# Patient Record
Sex: Male | Born: 1970 | Race: White | Hispanic: No | Smoking: Never smoker
Health system: Southern US, Community
[De-identification: ages and names within clinical notes are randomized; demographics above are authoritative.]

## PROBLEM LIST (undated history)

## (undated) DIAGNOSIS — M6289 Other specified disorders of muscle: Secondary | ICD-10-CM

## (undated) DIAGNOSIS — N319 Neuromuscular dysfunction of bladder, unspecified: Secondary | ICD-10-CM

## (undated) DIAGNOSIS — M8580 Other specified disorders of bone density and structure, unspecified site: Secondary | ICD-10-CM

## (undated) DIAGNOSIS — N4830 Priapism, unspecified: Secondary | ICD-10-CM

## (undated) DIAGNOSIS — K900 Celiac disease: Secondary | ICD-10-CM

## (undated) DIAGNOSIS — N419 Inflammatory disease of prostate, unspecified: Secondary | ICD-10-CM

## (undated) HISTORY — DX: Other specified disorders of muscle: M62.89

## (undated) HISTORY — PX: LUMBAR FUSION: SHX111

## (undated) HISTORY — DX: Inflammatory disease of prostate, unspecified: N41.9

## (undated) HISTORY — PX: ORTHOPEDIC SURGERY: SHX850

## (undated) HISTORY — DX: Celiac disease: K90.0

## (undated) HISTORY — DX: Priapism, unspecified: N48.30

## (undated) HISTORY — DX: Other specified disorders of bone density and structure, unspecified site: M85.80

## (undated) HISTORY — PX: NASAL SINUS SURGERY: SHX719

## (undated) HISTORY — DX: Neuromuscular dysfunction of bladder, unspecified: N31.9

---

## 2004-07-21 ENCOUNTER — Emergency Department (HOSPITAL_COMMUNITY): Admission: EM | Admit: 2004-07-21 | Discharge: 2004-07-21 | Payer: Self-pay | Admitting: Emergency Medicine

## 2005-12-19 ENCOUNTER — Ambulatory Visit: Payer: Self-pay | Admitting: Family Medicine

## 2006-01-03 ENCOUNTER — Ambulatory Visit: Payer: Self-pay | Admitting: Family Medicine

## 2006-01-29 ENCOUNTER — Ambulatory Visit: Payer: Self-pay | Admitting: Family Medicine

## 2006-02-11 ENCOUNTER — Ambulatory Visit: Payer: Self-pay | Admitting: Internal Medicine

## 2006-02-12 ENCOUNTER — Ambulatory Visit: Payer: Self-pay | Admitting: Internal Medicine

## 2006-02-12 ENCOUNTER — Encounter (INDEPENDENT_AMBULATORY_CARE_PROVIDER_SITE_OTHER): Payer: Self-pay | Admitting: *Deleted

## 2006-02-12 ENCOUNTER — Encounter (INDEPENDENT_AMBULATORY_CARE_PROVIDER_SITE_OTHER): Payer: Self-pay | Admitting: Specialist

## 2006-02-12 HISTORY — PX: COLONOSCOPY W/ BIOPSIES: SHX1374

## 2006-02-22 ENCOUNTER — Ambulatory Visit: Payer: Self-pay | Admitting: Cardiology

## 2006-03-04 ENCOUNTER — Ambulatory Visit (HOSPITAL_COMMUNITY): Admission: RE | Admit: 2006-03-04 | Discharge: 2006-03-04 | Payer: Self-pay | Admitting: Internal Medicine

## 2006-03-04 ENCOUNTER — Encounter: Payer: Self-pay | Admitting: Internal Medicine

## 2006-03-04 ENCOUNTER — Encounter (INDEPENDENT_AMBULATORY_CARE_PROVIDER_SITE_OTHER): Payer: Self-pay | Admitting: *Deleted

## 2006-03-08 ENCOUNTER — Ambulatory Visit: Payer: Self-pay | Admitting: Internal Medicine

## 2006-03-08 LAB — CONVERTED CEMR LAB
Alkaline Phosphatase: 90 units/L (ref 39–117)
Basophils Absolute: 0 10*3/uL (ref 0.0–0.1)
Basophils Relative: 0.2 % (ref 0.0–1.0)
CO2: 27 meq/L (ref 19–32)
Calcium: 7.9 mg/dL — ABNORMAL LOW (ref 8.4–10.5)
Chloride: 108 meq/L (ref 96–112)
Eosinophil percent: 2 % (ref 0.0–5.0)
Glucose, Bld: 76 mg/dL (ref 70–99)
Hemoglobin: 14.9 g/dL (ref 13.0–17.0)
Lymphocytes Relative: 18.9 % (ref 12.0–46.0)
MCV: 89 fL (ref 78.0–100.0)
Monocytes Absolute: 0.5 10*3/uL (ref 0.2–0.7)
Monocytes Relative: 7.9 % (ref 3.0–11.0)
Neutro Abs: 4.3 10*3/uL (ref 1.4–7.7)
Sed Rate: 5 mm/hr (ref 0–20)
Sodium: 141 meq/L (ref 135–145)

## 2006-03-13 ENCOUNTER — Ambulatory Visit: Payer: Self-pay | Admitting: Internal Medicine

## 2006-03-13 LAB — CONVERTED CEMR LAB
Bacteria, U Microscopic: NEGATIVE /hpf
Epithelial cells, urine: NEGATIVE /lpf
Specific Gravity, Urine: 1.02 (ref 1.000–1.03)
WBC number, urine, microscopy: NONE SEEN /hpf
pH: 6 (ref 5.0–8.0)

## 2006-03-14 ENCOUNTER — Encounter (INDEPENDENT_AMBULATORY_CARE_PROVIDER_SITE_OTHER): Payer: Self-pay | Admitting: *Deleted

## 2006-03-14 ENCOUNTER — Ambulatory Visit (HOSPITAL_COMMUNITY): Admission: RE | Admit: 2006-03-14 | Discharge: 2006-03-14 | Payer: Self-pay | Admitting: Internal Medicine

## 2006-03-14 ENCOUNTER — Ambulatory Visit: Payer: Self-pay | Admitting: Internal Medicine

## 2006-03-14 HISTORY — PX: UPPER GASTROINTESTINAL ENDOSCOPY: SHX188

## 2006-04-09 ENCOUNTER — Encounter: Admission: RE | Admit: 2006-04-09 | Discharge: 2006-07-08 | Payer: Self-pay | Admitting: Internal Medicine

## 2006-04-10 ENCOUNTER — Ambulatory Visit: Payer: Self-pay | Admitting: Family Medicine

## 2006-04-30 ENCOUNTER — Ambulatory Visit: Payer: Self-pay | Admitting: Internal Medicine

## 2006-04-30 LAB — CONVERTED CEMR LAB
RBC Folate: 266 ng/mL (ref 180–600)
Vit D, 1,25-Dihydroxy: 12 — ABNORMAL LOW (ref 20–57)

## 2006-05-03 ENCOUNTER — Ambulatory Visit: Payer: Self-pay | Admitting: Family Medicine

## 2006-05-08 ENCOUNTER — Ambulatory Visit: Payer: Self-pay | Admitting: Family Medicine

## 2006-11-01 ENCOUNTER — Ambulatory Visit: Payer: Self-pay | Admitting: Family Medicine

## 2006-11-01 DIAGNOSIS — J019 Acute sinusitis, unspecified: Secondary | ICD-10-CM | POA: Insufficient documentation

## 2007-01-31 ENCOUNTER — Ambulatory Visit: Payer: Self-pay | Admitting: Internal Medicine

## 2007-01-31 LAB — CONVERTED CEMR LAB: Vit D, 1,25-Dihydroxy: 31 (ref 30–89)

## 2007-02-13 ENCOUNTER — Ambulatory Visit: Payer: Self-pay | Admitting: Family Medicine

## 2007-02-13 DIAGNOSIS — E559 Vitamin D deficiency, unspecified: Secondary | ICD-10-CM | POA: Insufficient documentation

## 2007-02-13 DIAGNOSIS — K900 Celiac disease: Secondary | ICD-10-CM

## 2007-02-13 DIAGNOSIS — M549 Dorsalgia, unspecified: Secondary | ICD-10-CM | POA: Insufficient documentation

## 2007-02-13 DIAGNOSIS — M810 Age-related osteoporosis without current pathological fracture: Secondary | ICD-10-CM | POA: Insufficient documentation

## 2007-02-16 ENCOUNTER — Encounter: Admission: RE | Admit: 2007-02-16 | Discharge: 2007-02-16 | Payer: Self-pay | Admitting: Family Medicine

## 2007-02-18 ENCOUNTER — Ambulatory Visit: Payer: Self-pay | Admitting: Internal Medicine

## 2007-02-18 ENCOUNTER — Telehealth (INDEPENDENT_AMBULATORY_CARE_PROVIDER_SITE_OTHER): Payer: Self-pay | Admitting: *Deleted

## 2007-02-27 ENCOUNTER — Encounter: Payer: Self-pay | Admitting: Family Medicine

## 2007-02-28 ENCOUNTER — Encounter: Payer: Self-pay | Admitting: Family Medicine

## 2007-04-11 ENCOUNTER — Encounter: Payer: Self-pay | Admitting: Family Medicine

## 2008-04-08 IMAGING — CT CT ABDOMEN W/ CM
2 of 5 series · 16 of 46 positions shown, 18 images · IV contrast (APPLIED)
Comparison: None.

**CORRECTED REPORT**
 Please disregard: Clinical Data:   Left lower quadrant pain, diarrhea, night sweats, lymphoma.
 Clinical Data should read:   Left lower quadrant pain, diarrhea, night sweats.
CLINICAL DATA: Left lower quadrant pain, diarrhea, night sweats, lymphoma.
 ABDOMEN CT WITH CONTRAST ? 02/22/06 AT 5715 HOURS:
TECHNIQUE: Multidetector CT imaging of the abdomen was performed following the standard protocol during bolus administration of intravenous contrast.
 Contrast:  100 cc Omnipaque 300.
TECHNIQUE: Multidetector CT imaging of the pelvis was performed following the standard protocol during bolus administration of intravenous contrast.

[Series 2: abd_pel 5.0 b30f st · axial · 0.64mm/px · z∈[-451,-51]mm · 13 of 92 slices shown, 15 images]
[im 6/92  soft-tissue]
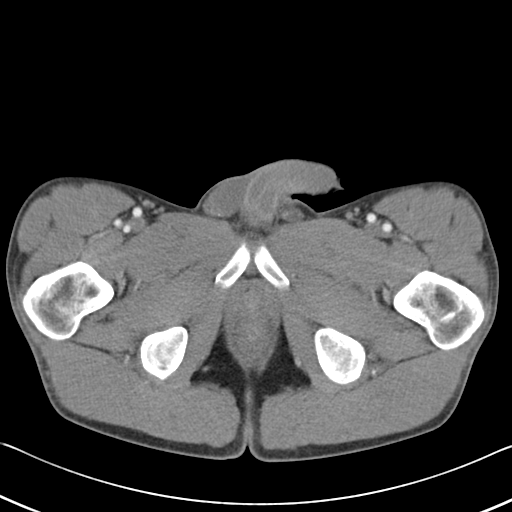
[im 6/92  bone]
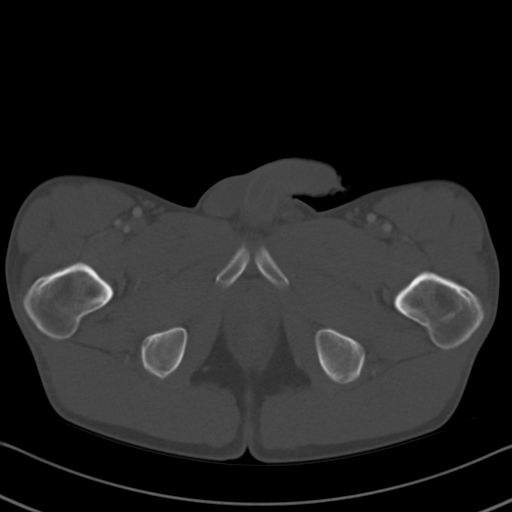
[im 11/92  soft-tissue]
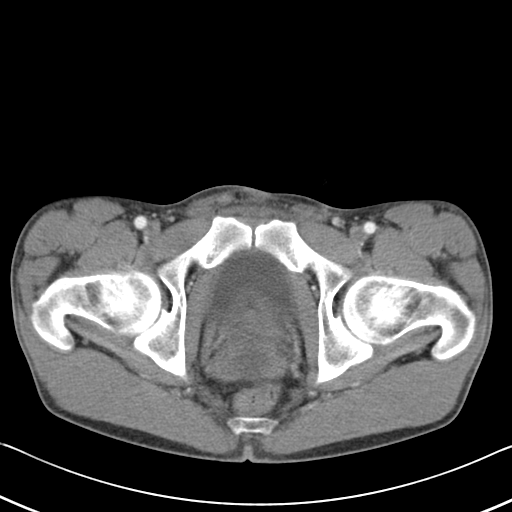
[im 21/92  soft-tissue]
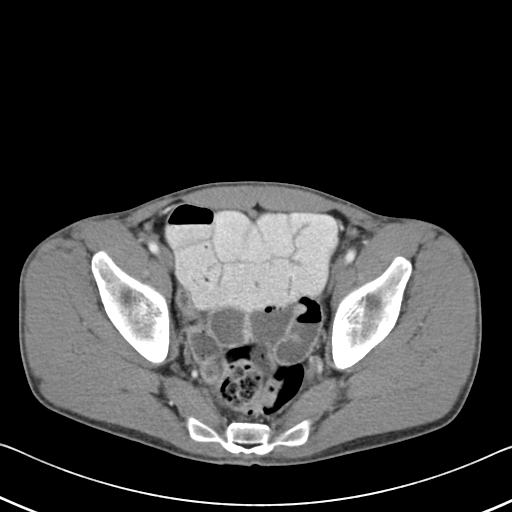
[im 26/92  soft-tissue]
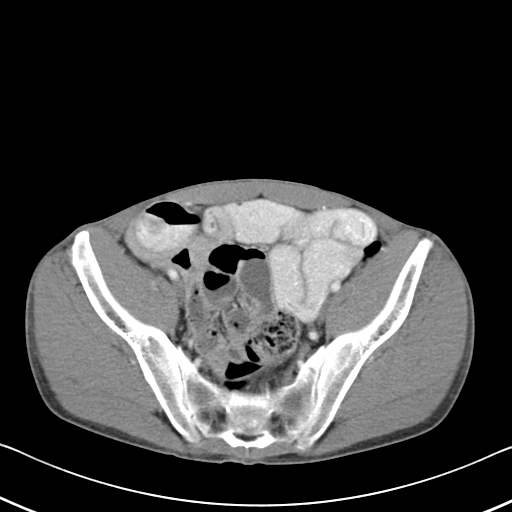
[im 31/92  soft-tissue]
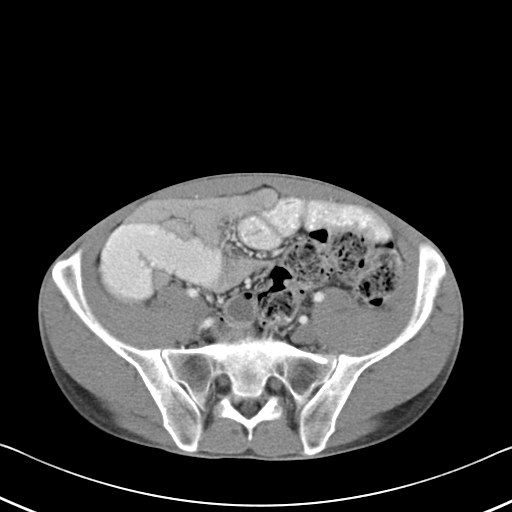
[im 41/92  soft-tissue]
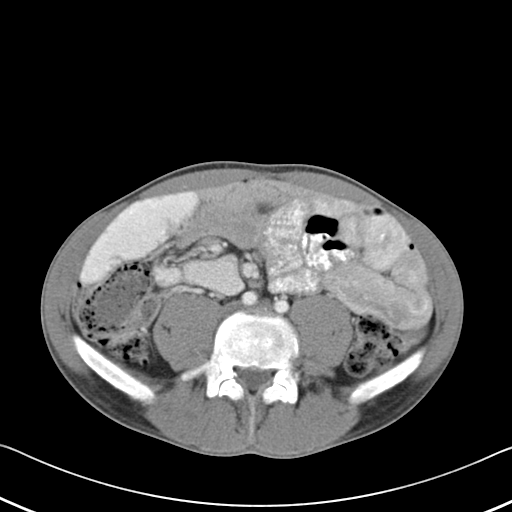
[im 46/92  soft-tissue]
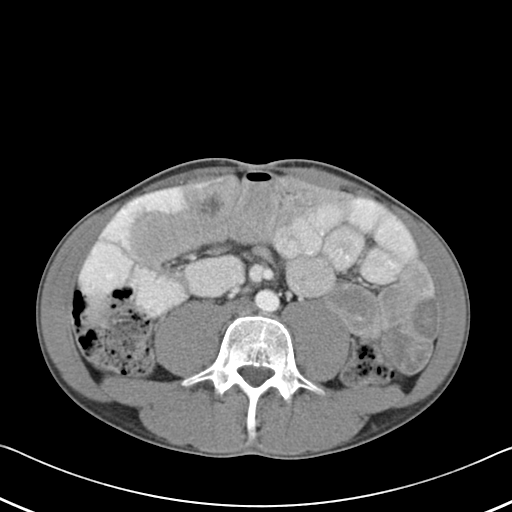
[im 51/92  soft-tissue]
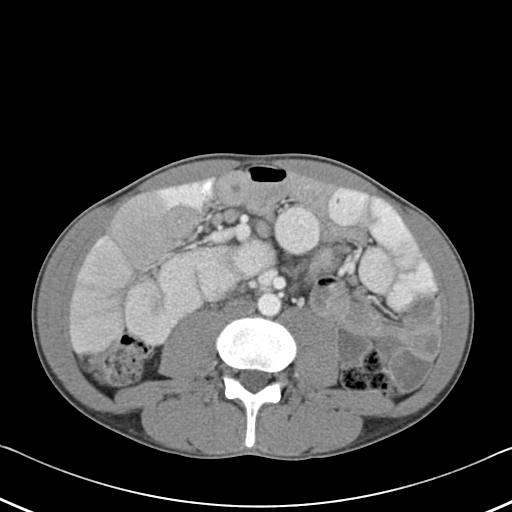
[im 61/92  soft-tissue]
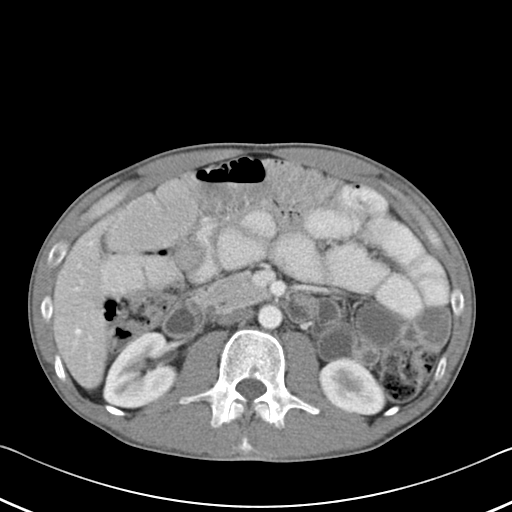
[im 61/92  bone]
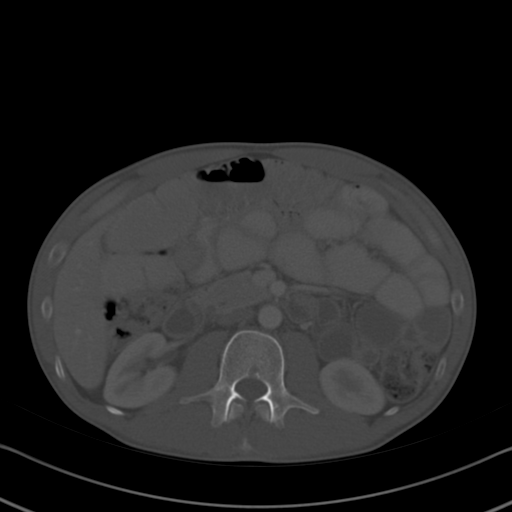
[im 66/92  soft-tissue]
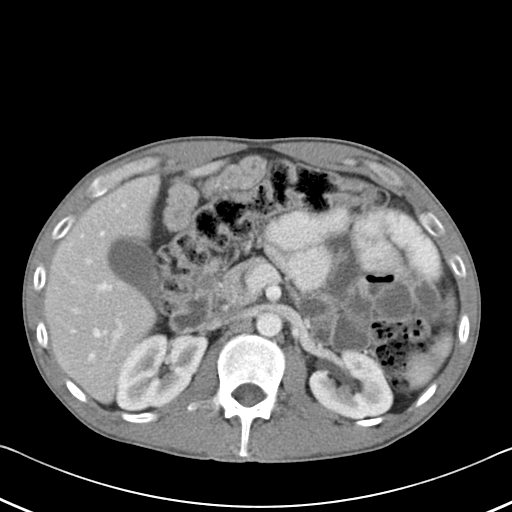
[im 71/92  soft-tissue]
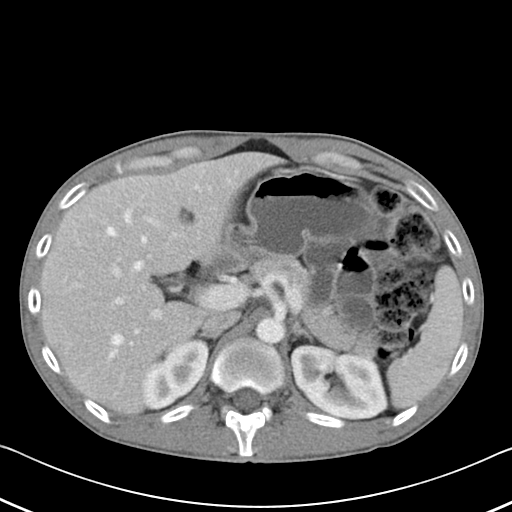
[im 81/92  soft-tissue]
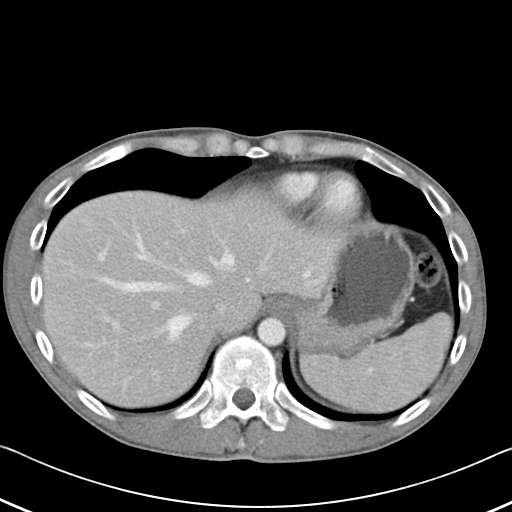
[im 86/92  soft-tissue]
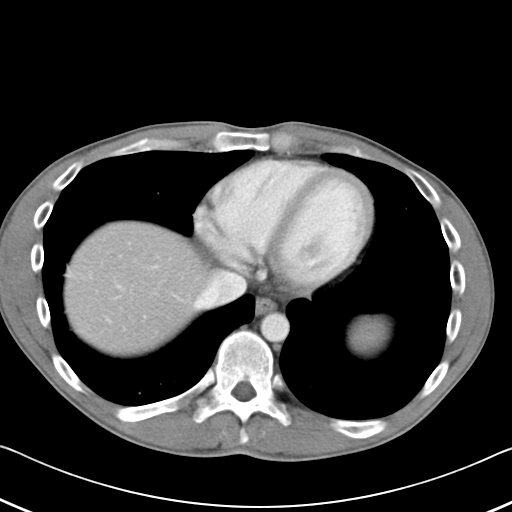

[Series 602: <mpr range> · coronal · 0.91mm/px · 3 of 39 slices shown]
[im 13/39  soft-tissue]
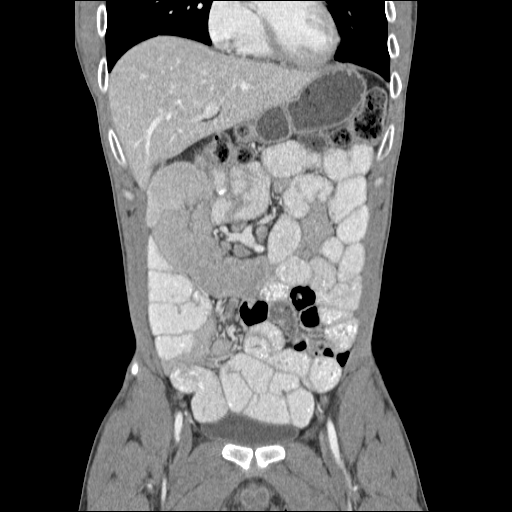
[im 17/39  soft-tissue]
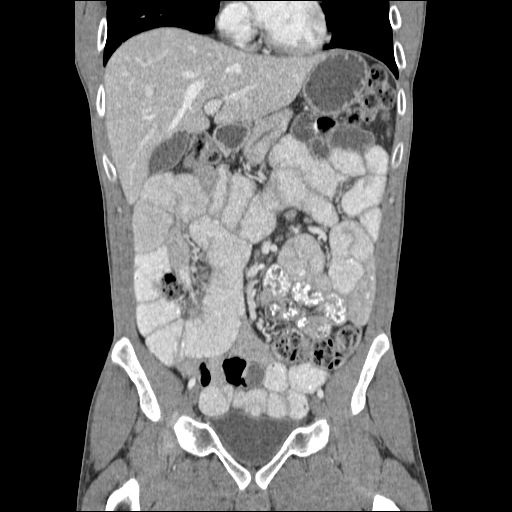
[im 22/39  soft-tissue]
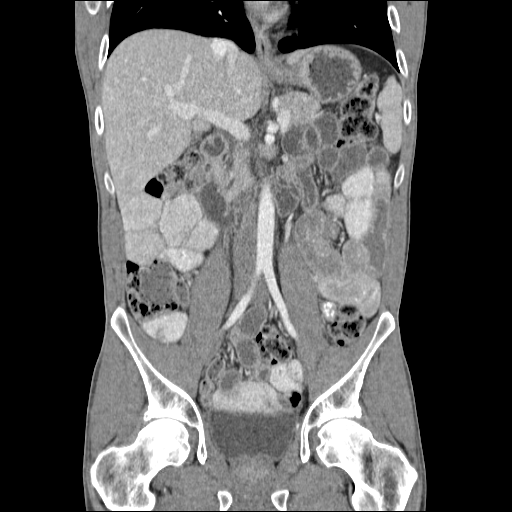

[16 of 46 positions shown; findings below may reference images not displayed]

FINDINGS: The liver, gallbladder, spleen, kidneys, adrenal glands, and pancreas are within normal limits.  Mildly distended small bowel loops are present involving the jejunum.  Loops of ileum are relatively decompressed.  In addition, there is a large amount of stool throughout the colon.  There is no clear transition point in the small bowel; however, the transition point would be estimated at the distal jejunum or early ileum.  Contrast has passed to the distal jejunum, but not ileum or colon. 
 Mildly enlarged mesenteric lymph nodes are present.  They are seen on images #38 through #49.  On image #39, there is a 13 mm short axis diameter mesenteric node.  On image #45, there is a 14 mm short axis diameter node.  Other smaller mesenteric nodes are present.  Negative paraaortic abnormal adenopathy.  There is no periportal adenopathy.  The vasculature is patent.
IMPRESSION: 1.  Mild mesenteric abnormal adenopathy.  Given the history of lymphoma, involvement of the mesenteric lymph nodes cannot be excluded. 
 2.  Distention of proximal small bowel and decompressed distal small bowel.  No clear transition point is identified; however, partial small bowel obstruction is not excluded.  Alternatively, this bowel distention may be related to constipation and stool throughout the colon.  Follow up studies are warranted to ensure resolution.
 PELVIS CT WITH CONTRAST:
FINDINGS: The bladder and prostate are within normal limits.  Negative abnormal adenopathy.  Negative free fluid.
IMPRESSION: No acute intrapelvic pathology.

## 2008-06-09 ENCOUNTER — Encounter: Payer: Self-pay | Admitting: Family Medicine

## 2008-06-11 ENCOUNTER — Encounter: Admission: RE | Admit: 2008-06-11 | Discharge: 2008-06-11 | Payer: Self-pay | Admitting: Neurosurgery

## 2008-06-15 ENCOUNTER — Encounter: Payer: Self-pay | Admitting: Family Medicine

## 2008-07-14 ENCOUNTER — Encounter: Payer: Self-pay | Admitting: Family Medicine

## 2008-07-16 ENCOUNTER — Encounter: Admission: RE | Admit: 2008-07-16 | Discharge: 2008-07-16 | Payer: Self-pay | Admitting: Neurosurgery

## 2008-08-09 ENCOUNTER — Ambulatory Visit: Payer: Self-pay | Admitting: Vascular Surgery

## 2008-08-09 ENCOUNTER — Inpatient Hospital Stay (HOSPITAL_COMMUNITY): Admission: RE | Admit: 2008-08-09 | Discharge: 2008-08-13 | Payer: Self-pay | Admitting: Neurosurgery

## 2008-09-08 ENCOUNTER — Encounter: Payer: Self-pay | Admitting: Family Medicine

## 2008-10-07 ENCOUNTER — Encounter: Admission: RE | Admit: 2008-10-07 | Discharge: 2008-10-07 | Payer: Self-pay | Admitting: Neurosurgery

## 2008-10-12 ENCOUNTER — Encounter: Payer: Self-pay | Admitting: Family Medicine

## 2008-11-10 ENCOUNTER — Encounter: Payer: Self-pay | Admitting: Family Medicine

## 2008-12-08 ENCOUNTER — Encounter: Payer: Self-pay | Admitting: Family Medicine

## 2009-01-07 ENCOUNTER — Encounter: Payer: Self-pay | Admitting: Family Medicine

## 2009-03-11 ENCOUNTER — Encounter: Payer: Self-pay | Admitting: Family Medicine

## 2009-04-25 ENCOUNTER — Ambulatory Visit: Payer: Self-pay | Admitting: Internal Medicine

## 2009-04-25 DIAGNOSIS — K5909 Other constipation: Secondary | ICD-10-CM

## 2009-04-25 DIAGNOSIS — F329 Major depressive disorder, single episode, unspecified: Secondary | ICD-10-CM

## 2009-04-26 LAB — CONVERTED CEMR LAB
ALT: 21 units/L (ref 0–53)
AST: 26 units/L (ref 0–37)
Basophils Absolute: 0 10*3/uL (ref 0.0–0.1)
Calcium: 9.9 mg/dL (ref 8.4–10.5)
Chloride: 103 meq/L (ref 96–112)
Creatinine, Ser: 0.9 mg/dL (ref 0.4–1.5)
Eosinophils Absolute: 0.1 10*3/uL (ref 0.0–0.7)
Ferritin: 168.6 ng/mL (ref 22.0–322.0)
Folate: 12.4 ng/mL
Free T4: 0.7 ng/dL (ref 0.6–1.6)
Hemoglobin: 14.3 g/dL (ref 13.0–17.0)
Lymphocytes Relative: 24.4 % (ref 12.0–46.0)
MCHC: 33 g/dL (ref 30.0–36.0)
Neutro Abs: 2.4 10*3/uL (ref 1.4–7.7)
Neutrophils Relative %: 64.6 % (ref 43.0–77.0)
RDW: 12.2 % (ref 11.5–14.6)

## 2009-04-27 ENCOUNTER — Telehealth: Payer: Self-pay | Admitting: Internal Medicine

## 2009-04-27 LAB — CONVERTED CEMR LAB: Tissue Transglutaminase Ab, IgA: 1.5 units (ref <7)

## 2009-05-26 ENCOUNTER — Ambulatory Visit: Payer: Self-pay | Admitting: Internal Medicine

## 2009-05-26 DIAGNOSIS — G4700 Insomnia, unspecified: Secondary | ICD-10-CM

## 2009-05-26 DIAGNOSIS — R109 Unspecified abdominal pain: Secondary | ICD-10-CM

## 2009-06-07 ENCOUNTER — Telehealth: Payer: Self-pay | Admitting: Internal Medicine

## 2009-06-09 ENCOUNTER — Ambulatory Visit: Payer: Self-pay | Admitting: Internal Medicine

## 2009-06-09 LAB — CONVERTED CEMR LAB
Bilirubin Urine: NEGATIVE
Hemoglobin, Urine: NEGATIVE
Leukocytes, UA: NEGATIVE
Nitrite: NEGATIVE
Urobilinogen, UA: 0.2 (ref 0.0–1.0)

## 2009-06-10 ENCOUNTER — Telehealth: Payer: Self-pay | Admitting: Internal Medicine

## 2009-06-21 ENCOUNTER — Ambulatory Visit: Payer: Self-pay | Admitting: Internal Medicine

## 2009-06-23 ENCOUNTER — Telehealth: Payer: Self-pay | Admitting: Internal Medicine

## 2009-07-11 ENCOUNTER — Telehealth: Payer: Self-pay | Admitting: Internal Medicine

## 2009-07-12 ENCOUNTER — Ambulatory Visit: Payer: Self-pay | Admitting: Internal Medicine

## 2009-07-14 ENCOUNTER — Telehealth: Payer: Self-pay | Admitting: Internal Medicine

## 2009-07-14 ENCOUNTER — Encounter: Payer: Self-pay | Admitting: Internal Medicine

## 2009-08-11 ENCOUNTER — Telehealth: Payer: Self-pay | Admitting: Internal Medicine

## 2009-08-12 ENCOUNTER — Telehealth: Payer: Self-pay | Admitting: Internal Medicine

## 2009-08-15 ENCOUNTER — Telehealth (INDEPENDENT_AMBULATORY_CARE_PROVIDER_SITE_OTHER): Payer: Self-pay | Admitting: *Deleted

## 2009-08-24 ENCOUNTER — Ambulatory Visit: Payer: Self-pay | Admitting: Diagnostic Radiology

## 2009-08-24 ENCOUNTER — Emergency Department (HOSPITAL_BASED_OUTPATIENT_CLINIC_OR_DEPARTMENT_OTHER): Admission: EM | Admit: 2009-08-24 | Discharge: 2009-08-24 | Payer: Self-pay | Admitting: Emergency Medicine

## 2009-08-31 ENCOUNTER — Telehealth: Payer: Self-pay | Admitting: Internal Medicine

## 2009-09-12 ENCOUNTER — Telehealth: Payer: Self-pay | Admitting: Internal Medicine

## 2009-09-19 ENCOUNTER — Ambulatory Visit: Payer: Self-pay | Admitting: Internal Medicine

## 2009-09-27 ENCOUNTER — Telehealth: Payer: Self-pay | Admitting: Internal Medicine

## 2009-09-27 ENCOUNTER — Encounter: Payer: Self-pay | Admitting: Internal Medicine

## 2009-09-29 ENCOUNTER — Telehealth (INDEPENDENT_AMBULATORY_CARE_PROVIDER_SITE_OTHER): Payer: Self-pay | Admitting: *Deleted

## 2009-10-05 ENCOUNTER — Telehealth: Payer: Self-pay | Admitting: Internal Medicine

## 2009-10-13 ENCOUNTER — Telehealth: Payer: Self-pay | Admitting: Internal Medicine

## 2009-11-10 ENCOUNTER — Telehealth (INDEPENDENT_AMBULATORY_CARE_PROVIDER_SITE_OTHER): Payer: Self-pay | Admitting: *Deleted

## 2009-12-08 ENCOUNTER — Encounter: Payer: Self-pay | Admitting: Internal Medicine

## 2009-12-16 ENCOUNTER — Encounter: Payer: Self-pay | Admitting: Family Medicine

## 2009-12-26 ENCOUNTER — Telehealth: Payer: Self-pay | Admitting: Internal Medicine

## 2010-01-24 ENCOUNTER — Telehealth: Payer: Self-pay | Admitting: Internal Medicine

## 2010-02-07 HISTORY — PX: ANORECTAL MANOMETRY: SHX298

## 2010-02-08 ENCOUNTER — Encounter: Payer: Self-pay | Admitting: Internal Medicine

## 2010-02-15 ENCOUNTER — Telehealth: Payer: Self-pay | Admitting: Internal Medicine

## 2010-03-10 ENCOUNTER — Telehealth: Payer: Self-pay | Admitting: Internal Medicine

## 2010-04-13 ENCOUNTER — Telehealth: Payer: Self-pay | Admitting: Internal Medicine

## 2010-04-13 ENCOUNTER — Ambulatory Visit: Admit: 2010-04-13 | Payer: Self-pay | Admitting: Internal Medicine

## 2010-04-16 ENCOUNTER — Encounter: Payer: Self-pay | Admitting: Internal Medicine

## 2010-04-25 NOTE — Progress Notes (Signed)
Summary: Questions about meds.  Phone Note Call from Patient Call back at Home Phone [phone]   Caller: Patient Call For: Dr. Leone Payor Reason for Call: Talk to Nurse Summary of Call: Has questions about his Clonazepam med. Initial call taken by: Karna Christmas,  October 13, 2009 11:47 AM  Follow-up for Phone Call        pt states he ahs been taking 2 Clonazepam at night and 1 during the day to help his nerves as he is going through "a stressfull time."  He is almost out of meds and wants to know if he can have enough to take 3 per day.  I advised pt that Dr. Leone Payor is out of the office until 7/26 and he will run out prior to that.  He states he will try to take only 1 tablet nightly until I can get back to him with an answer. Follow-up by: Francee Piccolo CMA Duncan Dull),  October 13, 2009 2:32 PM  Additional Follow-up for Phone Call Additional follow up Details #1::        refill clonazepam 2mg  1-2 by mouth at bedtime and 1 by mouth three times a day as needed anxiety #90 1 refill when is his [facility] appt?   Additional Follow-up by: Iva Boop MD, Clementeen Graham,  October 18, 2009 1:57 PM    Additional Follow-up for Phone Call Additional follow up Details #2::    pt notified that we will increase clonazepam to 90 tabs.  This will be called to [facility].  Per pharmacist, Bill, new rx given and all other refills for clonazepam discontinued.  Pt says to tell Dr. Leone Payor thank you. Pt's app[facility] at [facility] is 12/08/09. Follow-up by: Francee Piccolo CMA Duncan Dull),  October 18, 2009 2:16 PM  New/Updated Medications: CLONAZEPAM 2 MG TABS (CLONAZEPAM) take 1-2 tabs by mouth at bedtime as needed for sleep, take 1 tablet by mouth three times a day as needed anxiety Prescriptions: CLONAZEPAM 2 MG TABS (CLONAZEPAM) take 1-2 tabs by mouth at bedtime as needed for sleep, take 1 tablet by mouth three times a day as needed anxiety  #90 x 1   Entered by:   Francee Piccolo CMA (AAMA)   Authorized by:   Iva Boop MD, [facility]   Signed by:   Francee Piccolo CMA (AAMA) on 10/18/2009   Method used:   Telephoned to ...       [facility]  [address] [address] [phone][facility]       [address]       [place], [place]  [address]       Ph: [phone] or [phone]       Fax: [phone]   RxID:   [id number]

## 2010-04-25 NOTE — Assessment & Plan Note (Signed)
Summary: F/U FROM LAST OV                 Louis White    History of Present Illness Visit Type: Follow-up Visit Primary GI MD: Stan Head MD [facility] Primary Provider: Anderson Malta, MD  Requesting Provider: n/a Chief Complaint: Patient states that he has not had a bowel movement for 7 days; not sleeping at night History of Present Illness:   Seven days without a bowel movement despite Mg citrate, Dulcolax, MiraLax. Distends and bloats also. He stil has back pain and is using hydrocodone. Urination is 60% catheterized and he has to wait along time to urinate on his own.  He was taking 2 clonazepam at night but did not help sleep. requesting trazodone trial for insomnia. Back pain remains a major problem.   GI Review of Systems    Reports abdominal pain and  bloating.      Denies acid reflux, belching, chest pain, dysphagia with liquids, dysphagia with solids, heartburn, loss of appetite, nausea, vomiting, vomiting blood, weight loss, and  weight gain.      Reports constipation.     Denies anal fissure, black tarry stools, change in bowel habit, diarrhea, diverticulosis, fecal incontinence, heme positive stool, hemorrhoids, irritable bowel syndrome, jaundice, light color stool, liver problems, rectal bleeding, and  rectal pain.    CT Abdomen/Pelvis  Procedure date:  06/21/2009  Findings:      1 Bibasilar subsegmental atelectasis. 2.  Two stable small but benign appearing lesions in the liver are stable from 2007. 3.  Reduced prominence of mesenteric lymph nodes. 4.  Borderline wall thickening in the duodenum - duodenitis not excluded. 5.  Mild stranding of fat planes in the left inguinal region, query interval operative intervention in this vicinity.   Current Medications (verified): 1)  Hydrocodone-Acetaminophen 10-325 Mg Tabs (Hydrocodone-Acetaminophen) .... One Tablet By Mouth Every Four Hours 2)  Valium 10 Mg Tabs (Diazepam) .... One Tablet By Mouth At Bedtime 3)  Vitamin  D 1000 Unit Tabs (Cholecalciferol) .... Once Daily 4)  Mens Multivitamin Plus  Tabs (Multiple Vitamins-Minerals) .... Once Daily 5)  Tums 500 Mg Chew (Calcium Carbonate Antacid) .... As Needed 6)  Miralax  Powd (Polyethylene Glycol 3350) .... Take 1 Dose (17grams) Dissolved in Water Two Times A Day 7)  Cymbalta 60 Mg Cpep (Duloxetine Hcl) .Marland Kitchen.. 1 By Mouth Qd 8)  Clonazepam 2 Mg Tabs (Clonazepam) .Marland Kitchen.. 1 By Mouth At Bedtime 9)  Lansoprazole 30 Mg Cpdr (Lansoprazole) .Marland Kitchen.. 1 Each Day 30 Minutes Before A Meal (Usually Breakfast) 10)  Fleet Enema 7-19 Gm/182ml Enem (Sodium Phosphates) .Marland Kitchen.. 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 multiple sinus sx mult fractures and orthopedic sx L4-5 and L5-S1 fusion, complicated by bladder dysfunction, priapsm  Family History: Reviewed history from 04/25/2009 and no changes required. No FH of Colon Cancer:  Social History: Reviewed history from 04/25/2009 and no changes required. 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  Daily Caffeine Use: 3-4 glass daily  Illicit Drug Use - no  Vital Signs:  Patient profile:   40 year old male Height:      68 inches Weight:  175.25 pounds BMI:     26.74 Pulse rate:   68 / minute Pulse rhythm:   regular BP sitting:   110 / 72  (left arm) Cuff size:   regular  Vitals Entered By: June McMurray CMA Duncan Dull) (July 12, 2009 2:13 PM)  Physical Exam  General:  Well developed, well nourished, no acute distress. Abdomen:  low midline scar soft  and benign tender diffusely lower abd BS+ Psych:  Alert and cooperative. Normal  mood and affect.   Impression & Recommendations:  Problem # 1:  CONSTIPATION, CHRONIC (ICD-564.09) Assessment Unchanged add amitza 24 ug two times a day side effects discussed  Problem # 2:  FATIGUE, CHRONIC (ICD-780.79) Assessment: Improved mildly improved on Cymbalta  Problem # 3:  INSOMNIA (ICD-780.52) Assessment: Unchanged Clonazepam helps with anxiety and too many thoughts but not sleep. will try Trazadone - discussed possible worsening of constipaton and pripasm among other side effects  Problem # 4:  CELIAC DISEASE (ICD-579.0) Assessment: Unchanged  Problem # 5:  BACK PAIN (ICD-724.5) referral to Dr. Loletta Parish for this and neuropathy issues.  Problem # 6:  ? of DUODENITIS (ICD-535.60) lansoprazle x 2 mnths do not see that EGD indicated right now his celiac tests have been negative  Patient Instructions: 1)  Please pick up your medications at your pharmacy. 2)  New medication is Trazodone and also the Amitiza. 3)  Use the clonazepam as needed and not regularly to start (as you start the Trazodone).  4)  Start Amitiza 24 micrograms twice a day. If it is helping your constipation then call us and we will send a prescription. 5)  we will make a refrral to Dr. Dawayne Cirri re: your pain and neuropathy. Please get copies of your imaging made on a disc so that he can see them. 6)  Please schedule a follow-up appointment in 2 months.  7)  The medication list was reviewed and reconciled.  All changed / newly prescribed medications were explained.  A complete medication list was provided to the patient / caregiver. Prescriptions: CLONAZEPAM 2 MG TABS (CLONAZEPAM) 1 by mouth at bedtime  #30 x 1   Entered and Authorized by:   Iva Boop MD, [facility]   Signed by:   Iva Boop MD, FACG on 07/12/2009   Method used:   Printed then faxed to ...       [facility] (retail)       [address]       [place], [place]  [address]       Ph: [phone]       Fax: [phone]    RxID:   [id number] TRAZODONE HCL 50 MG TABS (TRAZODONE HCL) 1/2-1 tab qhs  #30 x 2   Entered and Authorized by:   Iva Boop MD, [facility]   Signed by:   Iva Boop MD, FACG on 07/12/2009   Method used:   Printed then faxed to ...       [facility] (retail)       [address]       [place], [place]  [address]       Ph: [phone]       Fax: [phone]   RxID:   [id number]

## 2010-04-25 NOTE — Progress Notes (Signed)
Summary: Referral Appt.   Phone Note Call from Patient Call back at Home Phone [phone]   Caller: Patient Call For: Dr. Leone Payor Reason for Call: Talk to Nurse Summary of Call: Needs to discuss the referral to [facility].  He is wanting to see Dr. Loletta Parish and they have him sch'd with a P.A. Initial call taken by: Karna Christmas,  August 31, 2009 9:59 AM  Follow-up for Phone Call        I encouraged the patient to see the PA today and ask to have his follow up wiht Dr Loletta Parish. Follow-up by: Darcey Nora RN, CGRN,  August 31, 2009 10:36 AM

## 2010-04-25 NOTE — Progress Notes (Signed)
Summary: call to pt re: CT results//Condition Update  Medications Added MIRALAX  POWD (POLYETHYLENE GLYCOL 3350) take 1 dose (17grams) dissolved in water two times a day CLONAZEPAM 2 MG TABS (CLONAZEPAM) 1 by mouth at bedtime LANSOPRAZOLE 30 MG CPDR (LANSOPRAZOLE) 1 each day 30 minutes before a meal (usually breakfast) FLEET ENEMA 7-19 GM/118ML ENEM (SODIUM PHOSPHATES) 1 every 2-3 days prn       Phone Note Outgoing Call   Summary of Call: please call him and tell him CT suggest possible duodenitis or even duodebal ulcer? otherwise nothing significant, it seems. He shopuld start a PPI (see rx) I need him to arrange for REV in 1 month but also ask him how he is doing right now as far as pain attacks and let me know Iva Boop MD, [facility]  June 23, 2009 11:25 AM   Follow-up for Phone Call        Message left for patient to callback. Laureen Ochs LPN  June 23, 2009 11:59 AM  Message left for patient to callback. Laureen Ochs LPN  June 24, 3084 8:56 AM   Above MD orders reviewed with patient. Pt. states."I had a really really huge blow-out 2 weeks ago, nothing since then, except maybe a little, itty,itty bit"  Takes Miralax 17gm and 2 ducolax daily, no BM for 1 week.  Pt. feels bloated and crampy. Pt. is scheduled for an OV on 07-12-09 at 2:15pm.  Follow-up by: Laureen Ochs LPN,  June 24, 2009 10:29 AM  Additional Follow-up for Phone Call Additional follow up Details #1::        I called him 1) he is to use citrate of Magnesia adn Fleet's enema if needed tonight 2) then MiraLax 2x/day with Fleet's enema every 2-3 days as needed 3) stopping Lunesta 4) take clonazepam 2mg  at bedtime (is taking 1-2 but separated by few hrs with mixed results). Additional Follow-up by: Iva Boop MD, Clementeen Graham,  June 24, 2009 5:50 PM    New/Updated Medications: MIRALAX  POWD (POLYETHYLENE GLYCOL 3350) take 1 dose (17grams) dissolved in water two times a day CLONAZEPAM 2 MG TABS  (CLONAZEPAM) 1 by mouth at bedtime LANSOPRAZOLE 30 MG CPDR (LANSOPRAZOLE) 1 each day 30 minutes before a meal (usually breakfast) FLEET ENEMA 7-19 GM/118ML ENEM (SODIUM PHOSPHATES) 1 every 2-3 days prn Prescriptions: CLONAZEPAM 2 MG TABS (CLONAZEPAM) 1 by mouth at bedtime  #30 x 0   Entered and Authorized by:   Iva Boop MD, [facility]   Signed by:   Iva Boop MD, FACG on 06/24/2009   Method used:   Printed then faxed to ...       [facility] (retail)       [address]       [place], [place]  [address]       Ph: [phone]       Fax: [phone]   RxID:   [id number] LANSOPRAZOLE 30 MG CPDR (LANSOPRAZOLE) 1 each day 30 minutes before a meal (usually breakfast)  #30 x 1   Entered and Authorized by:   Iva Boop MD, [facility]   Signed by:   Iva Boop MD, FACG on 06/23/2009   Method used:   Faxed to ...       [facility] (retail)       [address]       [place], [place]  [address]       Ph: [phone]  Fax: [phone]   RxID:   [id number]

## 2010-04-25 NOTE — Progress Notes (Signed)
Summary: anal mano results and follow-up   Phone Note Outgoing Call   Summary of Call: Anorectal manometry showed pelvic dyssynergia - abnormal emptying of rectum it is possible biofeedback could help this -  will discuss with him but find out how he is at this time Iva Boop MD, [facility]  February 15, 2010 5:41 PM   Follow-up for Phone Call        PC to pt to discuss results.  Pt states that he is no longer constipated!!!  Pt is now off of Cymbalta.  Pt states that he has been having 2-3 bowel movements per day.  He is now having the urge to have a bowel movement, which he did not have before, and states he is actually having some urgency.  He will occasionally take 2 dulcolax to soften things up a bit.  He is eating 5 prunes every day.  Pt is unsure if stopping the cymbalta or if the process of the manometry triggered something to cause him to have bowel movements again, but he would prefer not to have PT at this time and to let things be. Pt states he is having additional urgency on the days he takes dulcolax so I recommended to pt he may want to use miralax instead and he may use every other day if needed to help keep stool soft.  Pt is agreeable.  I advised pt Dr. Leone Payor would review note and add any recommendations. Med list updated. Follow-up by: Francee Piccolo CMA Duncan Dull),  February 27, 2010 4:14 PM  Additional Follow-up for Phone Call Additional follow up Details #1::        Great! Have him see me in 2012 ? Jan or Feb Iva Boop MD, [facility]  February 28, 2010 8:17 AM  pt notified of above.  He is scheduled for 1/19 @ 1:30pm.  He will call sooner with questions/concerns. Additional Follow-up by: Francee Piccolo CMA Duncan Dull),  February 28, 2010 9:21 AM    New/Updated Medications: DULCOLAX 5 MG TBEC (BISACODYL) take 2 tablets by mouth in the morning as needed MIRALAX   POWD (POLYETHYLENE GLYCOL 3350) dissovle 17 g in 8oz liquid once daily as needed   MISC.  Report  Procedure date:  02/08/2010  Findings:      ANORECTAL MANOMETRY  Anal Wink absent Good tone DRE  BET passed in 1 minute  resting Anal Sphincter hypertensive 107 mm Hg Squeeze anal sphincter pressure hypertensive at 370.6 mm Hg RAIR present @ 10 cc Sensation@ 80 cc Urge at 100 c Disconmfort at 130 cc  Type II Dyssynergia

## 2010-04-25 NOTE — Consult Note (Signed)
Summary: Gastroenterology/[facility]  Gastroenterology/[facility]   Imported By: Sherian Rein 12/28/2009 15:12:21  _____________________________________________________________________  External Attachment:    Type:   Image     Comment:   External Document

## 2010-04-25 NOTE — Progress Notes (Signed)
  Phone Note Other Incoming   Request: Send information Summary of Call: Request received from [employer]ability [employer] forwarded to [employer].       

## 2010-04-25 NOTE — Progress Notes (Signed)
  Phone Note Other Incoming   Request: Send information Summary of Call: Request for records received from [employer]. Request forwarded to [employer].     

## 2010-04-25 NOTE — Progress Notes (Signed)
Summary: Medication   Phone Note Call from Patient Call back at Home Phone [phone]   Caller: Patient Call For: Dr. Leone Payor Reason for Call: Refill Medication Summary of Call: Needs a script called in for [facility]  [facility] Initial call taken by: Karna Christmas,  Aug 12, 2009 2:33 PM  Follow-up for Phone Call        pt has follow up appt on 09/19/09.  Refill sent to last until that time. Home number went to voicemail of "Juliane Lack", no message left.  Work # is Recruitment consultant to Public librarian.  No message left. Follow-up by: Francee Piccolo CMA Duncan Dull),  Aug 12, 2009 2:53 PM    Prescriptions: AMITIZA 24 MCG  CAPS (LUBIPROSTONE) 1 two times a day/take with food and water  #60 x 1   Entered by:   Francee Piccolo CMA (AAMA)   Authorized by:   Iva Boop MD, [facility]   Signed by:   Francee Piccolo CMA (AAMA) on 08/12/2009   Method used:   [facility]ron[facility]        [facility] [address] [phone][facility] (retail)       [address]       [place], [place]  [address]       Ph: [phone] or [phone]       Fax: [phone]   RxID:   [id number]

## 2010-04-25 NOTE — Progress Notes (Signed)
Summary: lab report, to start Cymbalta and Ambien  Medications Added CYMBALTA 30 MG CPEP (DULOXETINE HCL) 1 by mouth once daily x 1 week CYMBALTA 60 MG CPEP (DULOXETINE HCL) 1 by mouth qd AMBIEN 10 MG  TABS (ZOLPIDEM TARTRATE) 1 by mouth q hs       Phone Note Outgoing Call   Summary of Call: discussed lab results (ok) with patient earlier today still constipated, no bowel mvt in 3 days....advised increasing MiraLax to 2/day and using intermittent Dulcolax believe he has depression and will start Cymbalta 30 mg daily x 7 days and then 60 mg daily (see if tabs and use half tab at first if possible).Marland KitchenMarland Kitchen# 1 month supply and 2 refills also needs Ambien 10 mg at bedtime as needed sleep #30 1 refill REV about 4 weeks RN to Rx and schedule tomorrow Iva Boop MD, [facility]  April 27, 2009 9:44 PM   Follow-up for Phone Call        No answer and no machine, I will continue to try and reach patient  Patient  aware, REV scheduled for 05-26-09 at 3:00. Follow-up by: Darcey Nora RN, CGRN,  April 28, 2009 3:38 PM    New/Updated Medications: CYMBALTA 30 MG CPEP (DULOXETINE HCL) 1 by mouth once daily x 1 week CYMBALTA 60 MG CPEP (DULOXETINE HCL) 1 by mouth qd AMBIEN 10 MG  TABS (ZOLPIDEM TARTRATE) 1 by mouth q hs Prescriptions: AMBIEN 10 MG  TABS (ZOLPIDEM TARTRATE) 1 by mouth q hs  #30 x 1   Entered by:   Darcey Nora RN, CGRN   Authorized by:   Iva Boop MD, [facility]   Signed by:   Darcey Nora RN, CGRN on 04/28/2009   Method used:   Printed then faxed to ...       [facility] (retail)       [address]       [place], [place]  [address]       Ph: [phone]       Fax: [phone]   RxID:   [id number] CYMBALTA 60 MG CPEP (DULOXETINE HCL) 1 by mouth qd  #30 x 2   Entered by:   Darcey Nora RN, CGRN   Authorized by:   Iva Boop MD, [facility]   Signed by:   Darcey Nora RN, CGRN on 04/28/2009   Method used:   Faxed to ...       [facility] (retail)       [address]    [place], [place]  [address]       Ph: [phone]       Fax: [phone]   RxID:   [id number] CYMBALTA 30 MG CPEP (DULOXETINE HCL) 1 by mouth once daily x 1 week  #7 x 0   Entered by:   Darcey Nora RN, CGRN   Authorized by:   Iva Boop MD, [facility]   Signed by:   Darcey Nora RN, CGRN on 04/28/2009   Method used:   Faxed to ...       [facility] (retail)       [address]       [place], [place]  [address]       Ph: [phone]       Fax: [phone]   RxID:   [id number]

## 2010-04-25 NOTE — Letter (Signed)
Summary: *Referral Letter  [facility]  [address] [place], [place] [address]   Phone: [phone]  Fax: [phone]    07/14/2009 Dawayne Cirri, MD [facility]  dear Bill: Thank you in advance for agreeing to see my patient:  Louis White 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 He also has celiac disease (controlled by diet). Please help with any further diagnostic evaluation, if needed and therapy for pain and/or neuropathy. He is relatively tolerant to benzodiazepines and has pain despite Cymbalta and narcotics. He should be getting    Current Medical Problems: 1)  ? of DUODENITIS (ICD-535.60) 2)  INSOMNIA (ICD-780.52) 3)  ABDOMINAL PAIN, LOWER (ICD-789.09) 4)  CONSTIPATION, CHRONIC (ICD-564.09) 5)  FATIGUE, CHRONIC (ICD-780.79) 6)  UNSPECIFIED VITAMIN D DEFICIENCY (ICD-268.9) 7)  OSTEOPOROSIS (ICD-733.00) 8)  CELIAC DISEASE (ICD-579.0) 9)  BACK PAIN (ICD-724.5) 10)  EXAMINATION, MEDICAL NEC, ADMN PURPOSES (ICD-V70.3) 11)  SINUSITIS, ACUTE NOS (ICD-461.9)   Current Medications: 1)  HYDROCODONE-ACETAMINOPHEN 10-325 MG TABS (HYDROCODONE-ACETAMINOPHEN) one tablet by mouth every four hours 2)  VALIUM 10 MG TABS (DIAZEPAM) one tablet by mouth at bedtime 3)  VITAMIN D 1000 UNIT TABS (CHOLECALCIFEROL) once daily 4)  MENS MULTIVITAMIN PLUS  TABS (MULTIPLE VITAMINS-MINERALS) once daily 5)  TUMS 500 MG CHEW (CALCIUM CARBONATE ANTACID) as needed 6)  MIRALAX  POWD (POLYETHYLENE GLYCOL 3350) take 1 dose (17grams) dissolved in water two times a day 7)  CYMBALTA 60 MG CPEP (DULOXETINE HCL) 1 by mouth qd 8)  CLONAZEPAM 2 MG TABS (CLONAZEPAM) 1 by mouth at bedtime 9)  LANSOPRAZOLE 30 MG CPDR (LANSOPRAZOLE) 1 each day 30 minutes before a  meal (usually breakfast) 10)  FLEET ENEMA 7-19 GM/118ML ENEM (SODIUM PHOSPHATES) 1 every 2-3 days prn 11)  TRAZODONE HCL 50 MG TABS (TRAZODONE HCL) 1/2-1 tab qhs 12)  AMITIZA 24 MCG  CAPS (LUBIPROSTONE) 1 two times a day/take with food and water 13)  TRILYTE 420 GM SOLR (PEG 3350-KCL-NA BICARB-NACL) drink 2-4 liters over 2-4 hours to promote bowel movements   Past Medical History: 1)  celiac sprue 2)  allergic rhinitis 3)  vitamin D deficiency 4)  osteopenia 5)  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   Sincerely,  Ashley Murrain MD, [facility]  Appended Document: Orders Update/Dr Loletta Parish    Clinical Lists Changes  Orders: Added new Test order of Misc. Referral (Misc. Ref) - Signed      Appended Document: *Referral Letter letter faxed to Dr Loletta Parish  left message with referral coordinator to schedule appt  Appended Document: *Referral Letter Records and referral form sent to Dr Synetta Fail office Darcey Nora RN, [facility]  July 20, 2009 8:28 AM     Clinical Lists Changes  Orders: Added new Referral order of Neurology Referral (Neuro) - Signed

## 2010-04-25 NOTE — Op Note (Addendum)
Summary: [facility]  [facility]   Imported By: Sherian Rein 04/27/2009 13:42:59  _____________________________________________________________________  External Attachment:    Type:   Image     Comment:   External Document

## 2010-04-25 NOTE — Procedures (Signed)
Summary: EGD Report   EGD  Procedure date:  03/14/2006  Findings:      Location: [facility]   Patient Name: Louis White, Louis White MRN:  Procedure Procedures: Panendoscopy (EGD) CPT: 43235.    with biopsy(s)/brushing(s). CPT: D1846139.  Personnel: Endoscopist: Iva Boop, MD, [facility].  Referred By: Kerby Nora, MD.  Exam Location: Exam performed in Endoscopy Suite.  Patient Consent: Procedure, Alternatives, Risks and Benefits discussed, consent obtained, from patient. Consent was obtained by the RN.  Indications Symptoms: Weight loss. Diarrhea. Abdominal pain,  History  Current Medications: Patient is not currently taking Coumadin.  Allergies: No known allergies.  Comments: Weight loss, low albumin, abdominal pain, diarrhea, night sweats. Colonoscopy unrevealing. Capsule endoscopy of small bowel suggestive of sprue. Pre-Exam Physical: Performed Feb 12, 2006  Cardio-pulmonary exam, HEENT exam, Abdominal exam, Mental status exam WNL.  Comments: Pt. history reviewed/updated, physical exam performed prior to initiation of sedation? yes Exam Exam Info: Maximum depth of insertion Duodenum, intended Duodenum. Patient position: on left side. Gastric retroflexion performed. Images taken. ASA Classification: I. Tolerance: excellent.  Sedation Meds: Patient assessed and found to be appropriate for moderate (conscious) sedation. Fentanyl 75 mcg. given IV. Versed 10 mg. given IV. Cetacaine Spray 2 sprays given aerosolized.  Monitoring: BP and pulse monitoring done. Oximetry used. Supplemental O2 given  Fluoroscopy: Fluoroscopy was not used.  Findings - Normal: Proximal Esophagus to Antrum. Biopsy/Normal taken. Comments: proximal gastric biopsies.  - MUCOSAL ABNORMALITY: Duodenal Bulb to Duodenal 2nd Portion. Mosaic/scaly mucosa. Comment: the mucosa looks somewhat scalloped.   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 Events  Unplanned Intervention: No unplanned interventions were required.  Plans Comments: Await path, TTG antibodies. Disposition: After procedure patient sent to recovery.  Comments: I will call with the results.   cc:   Kerby Nora, MD   The Patient   This report was created from the original endoscopy report, which was reviewed and signed by the above listed endoscopist.

## 2010-04-25 NOTE — Progress Notes (Signed)
Summary: constipation  Medications Added TRILYTE 420 GM SOLR (PEG 3350-KCL-NA BICARB-NACL) drink 2-4 liters over 2-4 hours to promote bowel movements       Phone Note Call from Patient Call back at Home Phone [phone]   Caller: Patient Call For: Dr. Leone Payor Reason for Call: Talk to Nurse Summary of Call: update on how pt is feeling: no BM for 10 days Initial call taken by: Vallarie Mare,  July 14, 2009 2:21 PM  Follow-up for Phone Call        Patient taking Valinda Hoar two times a day.  He had a very small BM this am.  He reports very small BM.  He is requesting a colon prep.  Please advise. Follow-up by: Darcey Nora RN, CGRN,  July 14, 2009 2:32 PM  Additional Follow-up for Phone Call Additional follow up Details #1::        see rx    Additional Follow-up for Phone Call Additional follow up Details #2::    Pt notified.  RX faxed to pharmacy as requested by pt. Follow-up by: Ashok Cordia RN,  July 14, 2009 4:07 PM  New/Updated Medications: TRILYTE 420 GM SOLR (PEG 3350-KCL-NA BICARB-NACL) drink 2-4 liters over 2-4 hours to promote bowel movements Prescriptions: TRILYTE 420 GM SOLR (PEG 3350-KCL-NA BICARB-NACL) drink 2-4 liters over 2-4 hours to promote bowel movements  #1 x 2   Entered and Authorized by:   Iva Boop MD, [facility]   Signed by:   Iva Boop MD, FACG on 07/14/2009   Method used:   Printed then faxed to ...       [facility] (retail)       [address]       [place], [place]  [address]       Ph: [phone]       Fax: [phone]   RxID:   [id number]

## 2010-04-25 NOTE — Progress Notes (Signed)
Summary: Refill Trazadone, Clonazepam   Phone Note From Pharmacy   Caller: Pharmacy Summary of Call: Faxed refill request for Clonazepam.  Electronic refill request for Trazadone.  Is it OK to refill these meds?  He did go to [facility], the consult note was in your folder.  He is due for refills on both of these medications. Initial call taken by: Francee Piccolo CMA Duncan Dull),  December 26, 2009 3:36 PM  Follow-up for Phone Call        ok to refill for total of 2 months tell him I saw Dr. Alyson Ingles note and some of the med may have made his constipation worse, and once manometry complete and follow-up with her done, will need to get some help with his regimen and try to get away from some of the meds that can cause constipation. I know it all started prior to the meds but they may be making some of it worse. Follow-up by: Iva Boop MD, Clementeen Graham,  December 26, 2009 5:26 PM  Additional Follow-up for Phone Call Additional follow up Details #1::        LM to [facility] at home number Francee Piccolo CMA Duncan Dull)  December 28, 2009 8:59 AM   RC from pt.  Fifteen minutes spent with the patient discussing various things.   Pt states he has had should surgery recently.  I advised him of Dr. Leone Payor recommendations above.  Pt states he has not heard from Dr.Thorne's office to schedule manometry.  Also says that he wasn't really happy with his experience there as he did not think they took his problem seriously and basically attributed his problem to his pain meds.  Pt was hoping for a bit more compassion. Pt has changed his medication regimine.  States he has stopped Miralax.  He is holding Trilyte for now due to weakness and dehydration following the prep.  He was using every 5 days.  He is now taking 3 dulcolax three times a day.  He says this is helping things move.  He has run out of Amitiza and did not refill.  He states that prior to the 9 daily dulcolax he did not notice a difference, but would be  willing to restart if we thought it would help his bowels move easier. Please advise. Additional Follow-up by: Francee Piccolo CMA Duncan Dull),  December 30, 2009 8:54 AM    Additional Follow-up for Phone Call Additional follow up Details #2::    Ok to refill x 2 months we are doing the best we can with a difficult problem he needs to understand that and give his doctors a chance to help him I think some of the med I started may have worsened the problems but helped in other ways (I have read dr. Alyson Ingles note and understand her point) if you can communicate that some please try - I have reached the limits of my ablity to help him and we can always get another opinion but not yet -  Thanks Follow-up by: Iva Boop MD, Clementeen Graham,  December 30, 2009 9:44 AM  Additional Follow-up for Phone Call Additional follow up Details #3:: Details for Additional Follow-up Action Taken: Does not need to restart Amitiza,  do not think 9 dulcolax a day is good long-term option, need the anorectal manometry done, still think surgery could be eeded and do not think she was excluding  lets call [facility] next week re: when is anorectal manometry and  then let me know. his overall med regimen will need to be lookd at as Dr. Jacinto Reap said Additional Follow-up by: Iva Boop MD, Clementeen Graham,  December 30, 2009 5:00 PM  New/Updated Medications: HYDROCODONE-ACETAMINOPHEN 10-325 MG TABS (HYDROCODONE-ACETAMINOPHEN) take 1-2 tablets by mouth every 6 hours as needed * *HOLD*TRILYTE 420 GM SOLR (PEG 3350-KCL-NA BICARB-NACL) drink 2-4 liters over 2-4 hours to promote bowel movements DULCOLAX 5 MG TBEC (BISACODYL) take 3 tab by mouth in AM, take 3 tab by mouth in PM, take 2 tab at bedtime  Appended Document: Refill Trazadone, Clonazepam Phone Note Outgoing Call   Call placed by: Francee Piccolo CMA Duncan Dull),  December 30, 2009 5:20 PM Summary of Call: I spoke woth pt and advised him of Dr.Keren Alverio's recommendations.  He is  agreeable.  I will call [facility] regarding manometry on Monday.  He is frustrated with [facility], but I told him it is hard to sometimes get the big picture from one visit, and we need to see how things work out after manometry.  Pt does feel a bit better by the end of the call and does voice a good understanding that this will take time and he knows we are trying to get him help.  Advised pt I will call him next week after talking to [facility]. Initial call taken by: Francee Piccolo CMA Duncan Dull),  December 30, 2009 5:25 PM  Follow-up for Phone Call        message left at [facility] anorectal manometry will be scheduled (had not yet been done) suspect due to delivery of child by Dr. Carnella Guadalajara, NP will be calling me so I can discuss his situation let Matt know - I anticipate they will call him to schedule  Follow-up by: Iva Boop MD, Clementeen Graham,  January 04, 2010 10:28 AM  Additional Follow-up for Phone Call Additional follow up Details #1::        I spoke to Lamonte Sakai, NP and [facility] plan is to set up anorectal manometry and sitz marks at [facility] is aware I will also ask Physical Med doc if there are any other ideas  Additional Follow-up by: Iva Boop MD, Clementeen Graham,  January 04, 2010 11:03 AM    Additional Follow-up for Phone Call Additional follow up Details #2::    Let him know that no new ideas from physical med doc sorry Iva Boop MD, [facility]  January 04, 2010 4:36 PM  Appt at [facility] for anorectal manometry on 02/08/10.  Pt aware. Follow-up by: Francee Piccolo CMA Duncan Dull),  January 10, 2010 8:39 AM

## 2010-04-25 NOTE — Assessment & Plan Note (Signed)
Summary: cramping pain nausea constipation.Louis White    History of Present Illness Visit Type: follow up  Primary GI MD: Stan Head MD [facility] Primary Provider: Anderson Malta, MD  Requesting Provider: n/a Chief Complaint: Nausea, constipation, and cramping  History of Present Illness:   40 yo white man known to me, has celiac disease. Last seen 2009. May 2010 had back/spine surgery L4-5, L-5S1 fusion.  Since then persistent back pain and other problems.  Every day he is tired, fatigued (as prior to gluten-free) Has sweats intermittenly x 3-4 mos. Irregular bowel movements. Up to 1 week without bm then 5 in a day starting with hard scyballous balls, then formed changng to loose.This is since surgery. Priapsm 6 hrs after surgery and had to in/out cath bladder x 2-3 months. 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 helped him to urinate spontaneously. Has had 3 spells since then where he does not get urge to void and then needs to cath. Surgery helped by relieving LE pain. Still has central low-back pain. Has tried light duty x 4 mos, then taken off. post-op July MR appropriate post-op changes spine Exhaustion or back pain limit him. He has little or no motivation and does not want to get out of bed. He realizes depression may be the problem. He is not suicidal at all. He is very concerned about having to go on permanant disability.  he remains compliant with gluten-free diet.  Last labs were pre-op, in 2010.     GI Review of Systems    Reports nausea.      Denies abdominal pain, acid reflux, belching, bloating, chest pain, dysphagia with liquids, dysphagia with solids, heartburn, loss of appetite, vomiting, vomiting blood, weight loss, and  weight gain.      Reports constipation.     Denies anal fissure, black tarry stools, change in bowel habit, diarrhea, diverticulosis, fecal incontinence, heme  positive stool, hemorrhoids, irritable bowel syndrome, jaundice, light color stool, liver problems, rectal bleeding, and  rectal pain.    Current Medications (verified): 1)  Hydrocodone-Acetaminophen 10-325 Mg Tabs (Hydrocodone-Acetaminophen) .... One Tablet By Mouth Every Four Hours 2)  Valium 10 Mg Tabs (Diazepam) .... One Tablet By Mouth At Bedtime 3)  Vitamin D 1000 Unit Tabs (Cholecalciferol) .... Once Daily 4)  Mens Multivitamin Plus  Tabs (Multiple Vitamins-Minerals) .... Once Daily 5)  Tums 500 Mg Chew (Calcium Carbonate Antacid) .... 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  Daily Caffeine Use: 3-4 glass daily  Illicit Drug Use - no Smoking Status:  never Drug Use:  no  Review of Systems       intermiitet ED extreme anhedonia concered about potential permannant disability has not worked full-time in 1 year not suicidal still has back pain still has stable girlfriend   Vital Signs:  Patient profile:   40 year old male Height:      68 inches Weight:      165 pounds BMI:     25.18 BSA:  1.89 Pulse rate:   64 / minute Pulse rhythm:   regular BP sitting:   128 / 76  (left arm) Cuff size:   regular  Vitals Entered By: Ok Anis CMA (April 25, 2009 10:54 AM)  Physical Exam  General:  Well developed, well nourished, no acute distress. Eyes:  anicteric Lungs:  Clear throughout to auscultation. Heart:  Regular rate and rhythm; no murmurs, rubs,  or bruits. Abdomen:  low  midline scar soft  and benign tender diffusely lower abd BS+ Rectal:  perianal inspection seems ok resting tone seems normal seems to descend and transiently relax when asked to squeeze valsalva by intent seems ok Psych:  Alert and cooperative. Normal mood and affect. Seems to choke up slightly when discussing depression   Impression & Recommendations:  Problem # 1:  FATIGUE, CHRONIC (ICD-780.79) His problems are very suggestive of depression. We discussed this and he accepts this.  Pending lab review, I think Cymbalta would be a good choice in someone with depression and chronic pain.  Orders: T-Tissue Transglutamase Ab IgA [id number]) TLB-CBC Platelet - w/Differential (85025-CBCD) TLB-CMP (Comprehensive Metabolic Pnl) (80053-COMP) TLB-TSH (Thyroid Stimulating Hormone) (84443-TSH) TLB-B12 + Folate Pnl ([id number]/UVO) TLB-Ferritin (82728-FER)  Problem # 2:  CELIAC DISEASE (ICD-579.0) Assessment: Unchanged Doubt this is part of his problems. I think bowel habit changhes are related to surgery and related post-op problems (same sacral nerves that affect bladdercan affect colon and rectum).  Problem # 3:  BACK PAIN (ICD-724.5) Assessment: New Persists post spinal fusion. this could respond to Cymbalta. tricyclics not best idea given constipation and bladder problems.  Problem # 4:  CONSTIPATION, CHRONIC (ICD-564.09) Assessment: New seems related to back surgery (not celiac) try MiraLax once daily gets urges but cannot produce bowel movement ? abnl rectal function on DRE today - relaxation of sphincter occurred when asked to squeeze  Patient Instructions: 1)  Please go to the basement to have your lab tests drawn today.  2)  Please use Miralax daily. 3)  Copy sent to : Anderson Malta, MD 4)  The medication list was reviewed and reconciled.  All changed / newly prescribed medications were explained.  A complete medication list was provided to the patient /  caregiver.

## 2010-04-25 NOTE — Assessment & Plan Note (Signed)
Summary: F/U APPT...LSW.    History of Present Illness Visit Type: Follow-up Visit Primary GI MD: Stan Head MD [facility] Primary Provider: Anderson Malta, MD  Requesting Provider: n/a Chief Complaint: Follow on Constipation History of Present Illness:   Patient complains of severe constipation says that he has been about 15 days without a BM. He did defecate with second Trilyte prep after day 10 and then on day 15. He would like to know what the next step could be he does not feel like he can take the constipation much longer.  MiraLax and Amitiza and dulcolax two times a day. Cycling some. Enemas no help. Cymbalta is helping alot with mood and back pain as well as other joint pains. Trazadone and Klonopin allowing sleep. To see Dr. Golda Acre of [facility].   Is asking if a colostomy is the answer.   GI Review of Systems      Denies abdominal pain, acid reflux, belching, bloating, chest pain, dysphagia with liquids, dysphagia with solids, heartburn, loss of appetite, nausea, vomiting, vomiting blood, weight loss, and  weight gain.      Reports constipation.     Denies anal fissure, black tarry stools, change in bowel habit, diarrhea, diverticulosis, fecal incontinence, heme positive stool, hemorrhoids, irritable bowel syndrome, jaundice, light color stool, liver problems, rectal bleeding, and  rectal pain.    Current Medications (verified): 1)  Hydrocodone-Acetaminophen 10-325 Mg Tabs (Hydrocodone-Acetaminophen) .... One Tablet By Mouth Every Four Hours 2)  Valium 10 Mg Tabs (Diazepam) .... One Tablet By Mouth At Bedtime 3)  Vitamin D 1000 Unit Tabs (Cholecalciferol) .... Once Daily 4)  Mens Multivitamin Plus  Tabs (Multiple Vitamins-Minerals) .... Once Daily 5)  Tums 500 Mg Chew (Calcium Carbonate Antacid) .... As Needed 6)  Miralax  Powd (Polyethylene Glycol 3350) .... Take 1 Dose (17grams) Dissolved in Water Two Times A Day 7)  Cymbalta 60 Mg Cpep (Duloxetine  Hcl) .Marland Kitchen.. 1 By Mouth Qd 8)  Clonazepam 2 Mg Tabs (Clonazepam) .Marland Kitchen.. 1 By Mouth At Bedtime 9)  Lansoprazole 30 Mg Cpdr (Lansoprazole) .Marland Kitchen.. 1 Each Day 30 Minutes Before A Meal (Usually Breakfast) 10)  Fleet Enema 7-19 Gm/136ml Enem (Sodium Phosphates) .Marland Kitchen.. 1 Every 2-3 Days Prn 11)  Trazodone Hcl 50 Mg Tabs (Trazodone Hcl) .... 1/2-1 Tab Qhs 12)  Amitiza 24 Mcg  Caps (Lubiprostone) .Marland Kitchen.. 1 Two Times A Day/take With Food and Water 13)  Trilyte 420 Gm Solr (Peg 3350-Kcl-Na Bicarb-Nacl) .... Drink 2-4 Liters Over 2-4 Hours To Promote Bowel Movements 14)  Ducodyl 5 Mg Tbec (Bisacodyl) .... Take Two By Mouth Once Daily  Allergies (verified): No Known Drug Allergies  Past History:  Past Medical History: Reviewed history from 07/12/2009 and no changes required. 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 multiple sinus sx mult fractures and orthopedic sx L4-5 and L5-S1 fusion, complicated by bladder dysfunction, priapsm  Family History: Reviewed history from 04/25/2009 and no changes required. No FH of Colon Cancer:  Social History: Reviewed history from 04/25/2009 and no changes required. 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  Daily Caffeine Use: 3-4 glass daily  Illicit Drug Use - no  Vital Signs:  Patient profile:   40 year old male Height:      68 inches Weight:  179.0 pounds BMI:     27.32 Pulse rate:   70 / minute Pulse rhythm:   regular BP sitting:   110 / 60  (right arm) Cuff size:   regular  Vitals Entered By: Harlow Mares CMA Duncan Dull) (September 19, 2009 4:21 PM)  Physical Exam  General:  Well developed, well nourished, no acute distress. Abdomen:  distended   Impression & Recommendations:  Problem #  1:  CONSTIPATION, CHRONIC (ICD-564.09) Assessment Unchanged overall aboutthe same, with poor response to laxatives for time being needs to purge every 5 days with PEG solution etiology appeasr to be related to nerve damage after spine surgery will refer to colon specialist at [facility]  he is asking about colostomy which could be an opton but needs ano-rectal manometry and tests for inertia I think (at [facility]  Problem # 2:  BACK PAIN (ICD-724.5) Assessment: Improved Cymbalta is helping - continue  Problem # 3:  DEPRESSION (ICD-311) Assessment: Improved Cymbalta is helping - continue  Problem # 4:  INSOMNIA (ICD-780.52) Assessment: Improved Cymbalta, clonazepam and Trazadone are helping - continue  Patient Instructions: 1)  Use Trilyte every 5-7 days if no bowel movement. 2)  We will refer you to [facility].  We will call you with appointment information. 3)  Copy sent to : Anderson Malta, MD 4)  The medication list was reviewed and reconciled.  All changed / newly prescribed medications were explained.  A complete medication list was provided to the patient / caregiver. Prescriptions: CLONAZEPAM 2 MG TABS (CLONAZEPAM) 1 by mouth at bedtime  #30 x 2   Entered by:   Francee Piccolo CMA (AAMA)   Authorized by:   Iva Boop MD, [facility]   Signed by:   Francee Piccolo CMA (AAMA) on 09/20/2009   Method used:   Reprint   RxID:   [id number] CLONAZEPAM 2 MG TABS (CLONAZEPAM) 1 by mouth at bedtime  #30 x 3   Entered by:   Francee Piccolo CMA (AAMA)   Authorized by:   Iva Boop MD, [facility]   Signed by:   Francee Piccolo CMA (AAMA) on 09/20/2009   Method used:   Printed then faxed to ...       [facility] (retail)       [address]       [place], [place]  [address]       Ph: [phone]       Fax: [phone]   RxID:   [id number] TRILYTE 420 GM SOLR (PEG 3350-KCL-NA BICARB-NACL) drink 2-4 liters over 2-4 hours to  promote bowel movements  #1 x 6   Entered by:   Francee Piccolo CMA (AAMA)   Authorized by:   Iva Boop MD, [facility]   Signed by:   Francee Piccolo CMA (AAMA) on 09/20/2009   Method used:   Electronically to        [facility] (retail)       [phone]       [place], [place]  [id number]       Ph: [phone]       Fax: [phone]   RxID:   [id number] AMITIZA 24 MCG  CAPS (LUBIPROSTONE) 1 two times a day/take with food and water  #60 x 3   Entered by:   Francee Piccolo CMA (AAMA)   Authorized by:   Iva Boop MD,  FACG   Signed by:   Francee Piccolo CMA (AAMA) on 09/20/2009   Method used:   Electronically to        [facility] (retail)       [address]       [place], [place]  [address]       Ph: [phone]       Fax: [phone]   RxID:   [id number] TRAZODONE HCL 50 MG TABS (TRAZODONE HCL) 1/2-1 tab qhs  #30 x 3   Entered by:   Francee Piccolo CMA (AAMA)   Authorized by:   Iva Boop MD, [facility]   Signed by:   Francee Piccolo CMA (AAMA) on 09/20/2009   Method used:   Electronically to        [facility] (retail)       [address]       [place], [place]  [id number]       Ph: [phone]       Fax: [phone]   RxID:   [id number]   Appended Document: F/U APPT...LSW. Patient aware of appointment with Dr Lucky Rathke at [facility].  They will send him a packet in the mail.   Clinical Lists Changes  Orders: Added new Test order of [facility]) - Signed

## 2010-04-25 NOTE — Letter (Signed)
Summary: [facility]  [facility]   Imported By: Lanelle Bal 03/29/2009 08:27:11  _____________________________________________________________________  External Attachment:    Type:   Image     Comment:   External Document

## 2010-04-25 NOTE — Progress Notes (Signed)
Summary: refill   Phone Note Call from Patient Call back at Home Phone [phone]   Caller: Patient Call For: Dr. Leone Payor Reason for Call: Talk to Nurse Summary of Call: would like a refill of Trilite... has to be called into [facility], [facility] Initial call taken by: Vallarie Mare,  September 12, 2009 10:38 AM  Follow-up for Phone Call        pt states he has filled Trilite rx twice and [facility] states [facility] is out of refills.  Pt also states that [facility] can not order name brand.  Pt states he has not had a bowel movement in 9 days and is getting uncomfortable.  Advised I will call in rx. Follow-up by: Francee Piccolo CMA Duncan Dull),  September 12, 2009 3:29 PM    Prescriptions: TRILYTE 420 GM SOLR (PEG 3350-KCL-NA BICARB-NACL) drink 2-4 liters over 2-4 hours to promote bowel movements  #1 x 2   Entered by:   Francee Piccolo CMA (AAMA)   Authorized by:   Iva Boop MD, [facility]   Signed by:   Francee Piccolo CMA (AAMA) on 09/12/2009   Method used:   Electronically to        [facility] (retail)       [phone] [address]       [place], [place]  [id number]       Ph: [phone]       Fax: [phone]   RxID:   [id number]

## 2010-04-25 NOTE — Progress Notes (Signed)
Summary: Cymbalta   Phone Note Call from Patient Call back at Home Phone [phone]   Caller: Patient Call For: Dr. Leone Payor Reason for Call: Talk to Nurse Summary of Call: ?'s regarding Cymbalta Initial call taken by: Vallarie Mare,  January 24, 2010 4:07 PM  Follow-up for Phone Call        [facility] to pt..was cut off during call...2 attempts to call back went to voicemail. Francee Piccolo CMA (AAMA)  January 24, 2010 5:30 PM  [facility] to pt.  Pt has looked at side effects of meds and is wondering if he can wean off of Cymbalta.  Susy Frizzle says med is working well.  He is now getting hand and face tingling.  Cymbalta contributes to constipation.  Pt has also recently began taking Oxycontin XR two times a day to help decrease hydrocodone use. Follow-up by: Francee Piccolo CMA Duncan Dull),  January 25, 2010 5:22 PM  Additional Follow-up for Phone Call Additional follow up Details #1::        worth a try - back pain may increase and depression could return but other options available take every other day for 1 week then every 3rd day x 3 and stop please ask him when he has anorectal mano  Additional Follow-up by: Iva Boop MD, Clementeen Graham,  January 26, 2010 9:28 AM    Additional Follow-up for Phone Call Additional follow up Details #2::    pt advised of the above and is agreeable.  Pt will call if symptoms return.  Anorectal mano on 11/16. Follow-up by: Francee Piccolo CMA Duncan Dull),  January 26, 2010 4:53 PM  New/Updated Medications: MIRALAX   POWD (POLYETHYLENE GLYCOL 3350) dissovle 17 g in 8oz liquid two times a day OXYCONTIN 20 MG XR12H-TAB (OXYCODONE HCL) Take 1 tablet by mouth two times a day

## 2010-04-25 NOTE — Assessment & Plan Note (Signed)
Summary: abd pain, fatigue, insomnia, back pain    History of Present Illness Visit Type: Follow-up Visit Primary GI MD: Stan Head MD [facility] Primary Provider: Anderson Malta, MD  Requesting Provider: n/a Chief Complaint: abd cramping  History of Present Illness:   40 yo white man seen last month to follow-up celiac disease but mainly due to constipation and abdominal pain after lumbar spine surgery. Celiac testing was negative indicating compliance with gluten-free diet. TSH and othe labs were ok. Cymbalta was initited for fatigue and depressive symptoms and to possibly help baclk pain. He has had no " gas" since  he  initially went gluten-free years ago but he has had cramps and gas-pains with flatus below abdominal scar. It can be severe. Cymbalta seems to be givng him some energy and he thinks it is helping. The Ambien is not helping him sleep at all. MiraLax and 2 dulcolax pills qd  to defecate every 2-3 days. the abdominal pain is not related to defecation, feel like a hot, severe burning pain.  he did feel like he had to defecate after 1 severe spell but could not defecate due to testicular pain overall lasted 1 hour  still self-catheterizes to empty bladder some, not all of the time  no dysuria    GI Review of Systems      Denies abdominal pain, acid reflux, belching, bloating, chest pain, dysphagia with liquids, dysphagia with solids, heartburn, loss of appetite, nausea, vomiting, vomiting blood, weight loss, and  weight gain.        Denies anal fissure, black tarry stools, change in bowel habit, constipation, diarrhea, diverticulosis, fecal incontinence, heme positive stool, hemorrhoids, irritable bowel syndrome, jaundice, light color stool, liver problems, rectal bleeding, and  rectal pain.    Current Medications (verified): 1)  Hydrocodone-Acetaminophen 10-325 Mg Tabs (Hydrocodone-Acetaminophen) .... One Tablet By Mouth Every Four Hours 2)  Valium 10 Mg Tabs  (Diazepam) .... One Tablet By Mouth At Bedtime 3)  Vitamin D 1000 Unit Tabs (Cholecalciferol) .... Once Daily 4)  Mens Multivitamin Plus  Tabs (Multiple Vitamins-Minerals) .... Once Daily 5)  Tums 500 Mg Chew (Calcium Carbonate Antacid) .... As Needed 6)  Miralax  Powd (Polyethylene Glycol 3350) .... Take 1 Dose (17grams) Dissolved in Water Once Daily 7)  Cymbalta 60 Mg Cpep (Duloxetine Hcl) .Marland Kitchen.. 1 By Mouth Qd 8)  Ambien 10 Mg  Tabs (Zolpidem Tartrate) .Marland Kitchen.. 1 By Mouth Q Hs  Allergies (verified): No Known Drug Allergies  Past History:  Past Medical History: Reviewed history from 04/25/2009 and no changes required. celiac sprue allergic rhinitis vitamin D deficiency osteopenia  Past Surgical History: Reviewed history from 04/25/2009 and no changes required. multiple sinus sx mult fractures and orthopedic sx L4-5 and L5-S1 fusion, complicated by bladder dysfunction, priapsm  Family History: Reviewed history from 04/25/2009 and no changes required. No FH of Colon Cancer:  Social History: Reviewed history from 04/25/2009 and no changes required. 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  Daily Caffeine Use: 3-4 glass daily  Illicit Drug Use - no  Vital Signs:  Patient profile:   40 year old male Height:      68 inches Weight:      170 pounds BMI:     25.94 BSA:     1.91 Pulse rate:   64 / minute Pulse rhythm:   regular BP sitting:   120 /  76  (left arm) Cuff size:   regular  Vitals Entered By: Ok Anis CMA (May 26, 2009 3:22 PM)  Physical Exam  General:  Well developed, well nourished, no acute distress. Psych:  Alert and cooperative. Normal mood and affect.  Denies suicidal ideation.   Impression & Recommendations:  Problem # 1:  ABDOMINAL PAIN, LOWER (ICD-789.09) Assessment New ? if related to bladder - cystitis/UTI possible could be  spastic or possibly from adhesions as he had an anterior approach also do not think it is from Cymbalta as does not seem to bve a known side effect but started since that was initiated will chekc UA and if negative CT abd/pelvis with contrast likely  Problem # 2:  FATIGUE, CHRONIC (ICD-780.79) Assessment: Improved better on Cymbalta have not officially called this depression but certainly has depressive features, perhaps adjustment disorder more appropriate  Problem # 3:  INSOMNIA (ICD-780.52) Assessment: Unchanged Ambien not helpful at all already on Valium at night for back Try Lunesta may need a tricyclic or other agent ? dc Valium and use clonazepam  Problem # 4:  BACK PAIN (ICD-724.5) Assessment: Unchanged ? slightly better on Cymbalta depending upon my GI work-up, am considering referral to pain specialist boarded in neurology and anesthesiology  Patient Instructions: 1)  Please go to the basement to have your lab tests drawn today.  2)  Please pick up your medications at your pharmacy. LUNESTA 3)  Please stop the Ambien and begin Lunesta. 4)  We will call you with further follow up instructions after we have your lab results. 5)  Copy sent to : Anderson Malta, MD 6)  The medication list was reviewed and reconciled.  All changed / newly prescribed medications were explained.  A complete medication list was provided to the patient / caregiver. Prescriptions: LUNESTA 2 MG TABS (ESZOPICLONE) 1 by mouth at bedtime  #30 x 1   Entered and Authorized by:   Iva Boop MD, [facility]   Signed by:   Iva Boop MD, [facility] on 05/26/2009   Method used:   Print then Give to Patient   RxID:   [id number]

## 2010-04-25 NOTE — Progress Notes (Signed)
Summary: neuro referral   Phone Note Call from Patient Call back at Home Phone [phone]   Caller: Patient Call For: Dr. Leone Payor Reason for Call: Talk to Nurse Summary of Call: has questions regarding neuro referral  Initial call taken by: Vallarie Mare,  August 31, 2009 3:24 PM  Follow-up for Phone Call        Left message for patient to call back Darcey Nora RN, [facility]  August 31, 2009 3:35 PM Patient  missed his first appointment with Dr Heloise Purpura office due to his mother being in the hospital, he was rescheduled for an appointment with the PA.  Today he was late for that  appointment  and they wouldn't see him, they told him they will have to have the office manager review the appointments and let him know if he can reschedule with their office.  He says they are very rude and he would like to know what Dr Leone Payor recommends from here. Follow-up by: Darcey Nora RN, CGRN,  August 31, 2009 3:51 PM  Additional Follow-up for Phone Call Additional follow up Details #1::        I thought Dr. Loletta Parish would be a good MD for him to see. Missing appointments, even with a legitmate reason, can be a problem like this and I cannot know but ? how communication went. I know Dr. Loletta Parish but not his staff... My suggestion would be for him to write or call them and ask in a conciliatory fashion for another chance with Dr. Loletta Parish, and make the appt. Additional Follow-up by: Iva Boop MD, Clementeen Graham,  September 01, 2009 8:43 AM    Additional Follow-up for Phone Call Additional follow up Details #2::    patient notified Darcey Nora RN, [facility]  September 01, 2009 9:36 AM   Appended Document: neuro referral Patient called Dr Heloise Purpura office and he actually spoke with Dr Loletta Parish, who refused to allow him to reschedule.  Dr Loletta Parish told him it was due to the way Mr Pantoja treated his office staff yesterday.  Patient  is asking who else you would recommend.  Appended Document: neuro referral That was my best  shot at this point I suggest he ask his PCP perhaps someone at [facility]?  Appended Document: neuro referral I have left a message with Dr Marvell Fuller recommendations.

## 2010-04-25 NOTE — Progress Notes (Signed)
Summary: schedule CT   Phone Note Outgoing Call   Summary of Call: call patient UA neg needs abd/pelvic CT re recurrent lower abd pain by mouth and iv contrast Iva Boop MD, [facility]  June 10, 2009 9:31 AM   Follow-up for Phone Call        Patient  scheduled for CT scan abdomen/pelvis CT scan 06-15-09 10:00.  Patient  advised to come pick up his instructions and contrast. Follow-up by: Darcey Nora RN, CGRN,  June 10, 2009 10:17 AM

## 2010-04-25 NOTE — Procedures (Signed)
Summary: Colonoscopy Report   Colonoscopy  Procedure date:  02/12/2006  Findings:      Results: Hemorrhoids.     Location:  [facility].   Patient Name: Harrold, Fitchett MRN:  Procedure Procedures: Colonoscopy CPT: 16109.    with biopsy. CPT: Q5068410.  Personnel: Endoscopist: Iva Boop, MD, [facility].  Referred By: Kerby Nora, MD.  Exam Location: Exam performed in Outpatient Clinic. Outpatient  Patient Consent: Procedure, Alternatives, Risks and Benefits discussed, consent obtained, from patient. Consent was obtained by the RN.  Indications Symptoms: Diarrhea Weight Loss.  History  Current Medications: Patient is not currently taking Coumadin.  Allergies: No known allergies.  Comments: Chronic diarrhhea, weight loss, cramps. Labs, stool studies ok. Also has night sweats. Pre-Exam Physical: Performed Feb 12, 2006. Cardio-pulmonary exam, Rectal exam, HEENT exam , Abdominal exam, Mental status exam WNL.  Comments: Pt. history reviewed/updated, physical exam performed prior to initiation of sedation? YES Exam Exam: Extent of exam reached: Terminal Ileum, extent intended: Terminal Ileum.  The cecum was identified by appendiceal orifice and IC valve. Patient position: on left side. Time to Cecum: 00:04:44. Time for Withdrawl: 00:10:30. Colon retroflexion performed. Images taken. ASA Classification: I. Tolerance: excellent.  Monitoring: Pulse and BP monitoring, Oximetry used. Supplemental O2 given.  Colon Prep Prep results: good.  Sedation Meds: Patient assessed and found to be appropriate for moderate (conscious) sedation. Fentanyl 50 mcg. given IV. Versed 5 mg. given IV.  Findings NORMAL EXAM: Terminal Ileum. Biopsy/Normal Exam taken.  - NORMAL EXAM: Cecum to Sigmoid Colon.  HEMORRHOIDS: Internal. Size: Grade I. ICD9: Hemorrhoids, Internal: 455.0.   Assessment  Diagnoses: 455.0: Hemorrhoids, Internal.   Comments: 1) NORMAL EXAM. NO EVIDENCE  FOR INFLAMMATORY BOWEL DISEASE. RANDOM BIOPSIES TAKEN.  Events  Unplanned Interventions: No intervention was required.  Plans Patient Education: Patient given standard instructions for: a normal exam.  Comments: empiric metronidazole 500 mg 3x/day for 10 days lomotil as needed Disposition: After procedure patient sent to recovery. After recovery patient sent home.  Scheduling/Referral: Telephone Contact, to The Patient, RE: BIOPSIES,    cc:   Kerby Nora, MD   The Patient

## 2010-04-25 NOTE — Letter (Signed)
Summary: *Referral Letter  [facility]  [address] [place], [place] [address]   Phone: [phone]  Fax: [phone]    09/27/2009  Gibson Ramp, MD [facility] Division of Gastroenterology  Thank you in advance for agreeing to see my patient:  Louis White [address] 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 He also has celiac disease. He has inquired about possibly needing colostomy. I believe he needs motility evaluation before anything like that, including ano-rectal manometry and ask for your evaluation.  Procedures Requested:    Current Medications: 1)  HYDROCODONE-ACETAMINOPHEN 10-325 MG TABS (HYDROCODONE-ACETAMINOPHEN) one tablet by mouth every four hours 2)  VALIUM 10 MG TABS (DIAZEPAM) one tablet by mouth at bedtime 3)  VITAMIN D 1000 UNIT TABS (CHOLECALCIFEROL) once daily 4)  MENS MULTIVITAMIN PLUS  TABS (MULTIPLE VITAMINS-MINERALS) once daily 5)  TUMS 500 MG CHEW (CALCIUM CARBONATE ANTACID) as needed 6)  MIRALAX  POWD (POLYETHYLENE GLYCOL 3350) take 1 dose (17grams) dissolved in water two times a day 7)  CYMBALTA 60 MG CPEP (DULOXETINE HCL) 1 by mouth qd 8)  CLONAZEPAM 2 MG TABS (CLONAZEPAM) 1 by mouth at bedtime 9)  LANSOPRAZOLE 30 MG CPDR (LANSOPRAZOLE) 1 each day 30 minutes before a meal (usually breakfast) 10)  FLEET ENEMA 7-19 GM/118ML ENEM (SODIUM PHOSPHATES) 1 every 2-3 days prn 11)  TRAZODONE HCL 50 MG TABS (TRAZODONE HCL) 1/2-1 tab qhs 12)  AMITIZA 24 MCG  CAPS (LUBIPROSTONE) 1 two times a day/take with food and water 13)  TRILYTE 420 GM SOLR (PEG 3350-KCL-NA BICARB-NACL) drink 2-4 liters over 2-4 hours to promote bowel movements 14)  DUCODYL 5 MG TBEC (BISACODYL) take two by mouth once daily   Past Medical History: 1)  celiac sprue 2)  allergic rhinitis 3)  vitamin D deficiency 4)  osteopenia 5)  post-operative autonomic neuropathy -  bowel and bladder     Thank you again for agreeing to see our patient; please contact us if you have any further questions or need additional information.  Sincerely,  Iva Boop MD, [facility]  Appended Document: *Referral Letter faxed to [facility]

## 2010-04-25 NOTE — Progress Notes (Signed)
Summary: refills   Phone Note Call from Patient Call back at Home Phone [phone]   Caller: Patient Call For: Leone Payor Reason for Call: Refill Medication Summary of Call: Patient needs refills for his rx Pantaprazole because Dr Leone Payor told him to take two at night and his rx refills are only for one pill at night. Initial call taken by: Tawni Levy,  July 11, 2009 2:45 PM  Follow-up for Phone Call        message left for pt to return call. Francee Piccolo CMA Duncan Dull)  July 12, 2009 9:11 AM   Pt has appt today.  We will discuss at appt. Follow-up by: Francee Piccolo CMA [facility]),  July 12, 2009 2:06 PM

## 2010-04-25 NOTE — Procedures (Signed)
Summary: Anorectal Manometry/[facility]  Anorectal Manometry/[facility]   Imported By: Sherian Rein 02/22/2010 11:40:30  _____________________________________________________________________  External Attachment:    Type:   Image     Comment:   External Document

## 2010-04-25 NOTE — Progress Notes (Signed)
  Phone Note Other Incoming   Request: Send information Summary of Call: Request for records received from [employer]. Request forwarded to [employer].

## 2010-04-25 NOTE — Progress Notes (Signed)
Summary: Triage   Phone Note Call from Patient Call back at Home Phone [phone]   Caller: Patient Call For: Dr. Leone Payor Reason for Call: Talk to Nurse Summary of Call: pt. is taking Trilyte and still No BM in the last 2wks Initial call taken by: Karna Christmas,  October 05, 2009 3:18 PM  Follow-up for Phone Call        Patient  took trylite gallon x2.  He took the first one on the 28th, and then again on 10/03/09.  No BM since.  Abdomen is very large and distended. Follow-up by: Darcey Nora RN, CGRN,  October 05, 2009 3:26 PM  Additional Follow-up for Phone Call Additional follow up Details #1::        arrange for therapeutic gastrograffen enema Additional Follow-up by: Iva Boop MD, Clementeen Graham,  October 05, 2009 5:26 PM    Additional Follow-up for Phone Call Additional follow up Details #2::    I arranged enema for this am at 10:30.  I called the patient and he advised me he took another gallon of trilyte last night and he reports he had very large BM and now is completely cleaned out. I have canceled the enema with Ra[facility]ord Noble at [facility] Follow-up by: Darcey Nora RN, CGRN,  October 06, 2009 8:40 AM

## 2010-04-25 NOTE — Letter (Signed)
Summary: [facility]  [facility]   Imported By: Lanelle Bal 12/29/2009 13:34:04  _____________________________________________________________________  External Attachment:    Type:   Image     Comment:   External Document

## 2010-04-25 NOTE — Progress Notes (Signed)
Summary: Refill Clonazepam  Phone Note Refill Request Message from:  Fax from Pharmacy  Refills Requested: Medication #1:  CLONAZEPAM 2 MG TABS 1 by mouth at bedtime   Dosage confirmed as above?Dosage Confirmed   Supply Requested: 1 month   Last Refilled: 07/12/2009  Method Requested: Fax to Local Pharmacy Next Appointment Scheduled: September 19, 2009 Initial call taken by: Francee Piccolo CMA [facility] Dull),  Aug 11, 2009 9:21 AM Caller: [facility]  Follow-up for Phone Call        ok to refill with 1 refill (60 days overall) Iva Boop MD, [facility]  Aug 11, 2009 10:07 AM  refill sent to pharm. Francee Piccolo CMA [facility] Dull)  Aug 11, 2009 12:22 PM  Follow-up by: Francee Piccolo CMA [facility]ull),  Aug 11, 2009 12:22 PM    Prescriptions: CLONAZEPAM 2 MG TABS (CLONAZEPAM) 1 by mouth at bedtime  #30 x 1   Entered by:   Francee Piccolo CMA (AAMA)   Authorized by:   Iva Boop MD, [facility]   Signed by:   Francee Piccolo CMA (AAMA) on 08/11/2009   Method used:   Printed then faxed to ...       [facility] (retail)       [address]       [place], [place]  [address]       Ph: [phone]       Fax: [phone]   RxID:   [id number]

## 2010-04-25 NOTE — Progress Notes (Signed)
Summary: CLONAZEPAM REFILL  ---- Converted from flag ---- ---- 09/27/2009 4:23 PM, Harlow Mares CMA (AAMA) wrote: pt called and would like you to cal him back again. ------------------------------  Phone Note Call from Patient Call back at Home Phone [phone]   Caller: Patient Reason for Call: Talk to Nurse Summary of Call: Pt last had clonazepam filled on 09/08/09.  Pt states he has a lot going on right now with impending surgery, moving, retirement/disability.  Pt may take two tablets at bedtime and has been taking during the day if he begins to get too overwhelmed.  Pt states he has one clonazepam and 1 trazadone left.  Pharmacy will not let him refill either again until 10/08/09.  Pt would like to know if we can call pharmacy for an early refill. Pt also states that [facility] Spine did not show anything that neuro at [facility] could fix so he is going ahead with referral to [facility]. Initial call taken by: Francee Piccolo CMA [facility] Dull),  September 27, 2009 4:43 PM  Follow-up for Phone Call        clonazepam 2 mg tabs, 1-2 by mouth at bedtime as needed sleep and 1 every 8 hrs as needed as needed anxiety #60 2 refill Iva Boop MD, [facility]  September 28, 2009 12:44 PM   Additional Follow-up for Phone Call Additional follow up Details #1::        I spoke to pt and advised him we will call in new rx for clonazepam.  New rx called to Ryan at [facility].  Refills called in on 09/20/09 were cancelled.  Dr. Leone Payor, can pt have an early refill on trazadone?  He is out.   Francee Piccolo CMA [facility] Dull)  September 28, 2009 1:21 PM  refill Trazodonex 1 with 2 refills as written Iva Boop MD, [facility]  September 28, 2009 3:07 PM  message left for pt that [facility] for [facility]ill. trazadone rx at [facility] cancelled.  Additional Follow-up by: Francee Piccolo CMA [facility] Dull),  September 30, 2009 5:11 PM    New/Updated Medications: CLONAZEPAM 2 MG TABS  (CLONAZEPAM) take 1-2 tabs by mouth at bedtime as needed for sleep, take 1 tablet by mouth every 8 hours as needed anxiety Prescriptions: TRAZODONE HCL 50 MG TABS (TRAZODONE HCL) 1/2-1 tab qhs  #30 x 2   Entered by:   Francee Piccolo CMA (AAMA)   Authorized by:   Iva Boop MD, [facility]   Signed by:   Francee Piccolo CMA (AAMA) on 09/30/2009   Method used:   [facility]        [facility]  [address] [address] [phone][facility]ail)       [address]       [place], [place]  [address]       Ph: [phone] or [phone]       Fax: [phone]   RxID:   [id number] CLONAZEPAM 2 MG TABS (CLONAZEPAM) take 1-2 tabs by mouth at bedtime as needed for sleep, take 1 tablet by mouth every 8 hours as needed anxiety  #60 x 2   Entered by:   Francee Piccolo CMA (AAMA)   Authorized by:   Iva Boop MD, [facility]   Signed by:   Francee Piccolo CMA (AAMA) on 09/28/2009   Method used:   Telephoned to .[facility]land Kitchen[facility]  [facility] (retail)       [address]       [place], [place]  [id number]       Ph: [phone]       Fax: [phone]   RxID:   [id number]

## 2010-04-25 NOTE — Progress Notes (Signed)
Summary: sleeping pills  Medications Added CLONAZEPAM 1 MG TBDP (CLONAZEPAM) 1 by mouth at bedtime if 1 not helpful take 2       Phone Note Call from Patient Call back at Home Phone [phone]   Caller: Patient Call For: Dr. Leone Payor Reason for Call: Talk to Nurse Summary of Call: pt says he would like to discuss a "new plan" he has with his sleeping pills Initial call taken by: Vallarie Mare,  June 07, 2009 1:21 PM  Follow-up for Phone Call        Patient has had no results from [facility] and it has made him tachcycardic with resting HR 106.  He would like to try Xanax 1 mg.  He feels his insomnia  is from anxiety and depression.  He was given xanax after lasik surgery and this helped with sleeping.  Please advise Follow-up by: Darcey Nora RN, CGRN,  June 07, 2009 3:06 PM  Additional Follow-up for Phone Call Additional follow up Details #1::        I recommend clonazepam insytead - will sign Rx when I getther next 1-2 days it is similar to but less habit forming than xanax how is abd pain - did he get UA? needs to stop Lunesta and Valium Additional Follow-up by: Iva Boop MD, Clementeen Graham,  June 07, 2009 9:19 PM    Additional Follow-up for Phone Call Additional follow up Details #2::    Pt. contacted and Dr.Avneet Ashmore's orders reviewed with him.He did cath. himself this am. and is bringing specimen in today.Says his pain remains the same. Follow-up by: Teryl Lucy RN,  June 08, 2009 8:48 AM  Additional Follow-up for Phone Call Additional follow up Details #3:: Details for Additional Follow-up Action Taken: Patient  will come by and pick up clonazepam RX.  He takes valium as needed for his back spasms from surgery.  He ususally takes one daily, not necessarily at night.  He does understand that he can't take Valium and Clonazepam at the same time.  He changed to taking his Cymbalta in the am. Additional Follow-up by: Darcey Nora RN, CGRN,  June 09, 2009 10:01 AM  New/Updated  Medications: CLONAZEPAM 1 MG TBDP (CLONAZEPAM) 1 by mouth at bedtime if 1 not helpful take 2 Prescriptions: CLONAZEPAM 1 MG TBDP (CLONAZEPAM) 1 by mouth at bedtime if 1 not helpful take 2  #30 x 0   Entered by:   Darcey Nora RN, CGRN   Authorized by:   Iva Boop MD, [facility]   Signed by:   Darcey Nora RN, CGRN on 06/09/2009   Method used:   Reprint   RxID:   [id number] CLONAZEPAM 1 MG TBDP (CLONAZEPAM) 1 by mouth at bedtime if 1 not helpful take 2  #30 x 0   Entered and Authorized by:   Iva Boop MD, [facility]   Signed by:   Iva Boop MD, [facility] on 06/07/2009   Method used:   Print then Give to Patient   RxID:   [id number]

## 2010-04-27 NOTE — Progress Notes (Signed)
Summary: Triage   Phone Note Call from Patient Call back at Home Phone [phone]   Caller: Patient Call For: Dr. Leone Payor Reason for Call: Talk to Nurse Summary of Call: Pt is constipated and could not come to appt today because he can not pay copay or for gas to get here, just wants [place] to know that he is constipated we can call him back if we want?!? Also...does gessner want to charge for canceling day of Initial call taken by: [place] Johnson,  April 13, 2010 10:40 AM  Follow-up for Phone Call        I will not charge I cannot tell for sure what he is looking for 1) No more med refills until he follows-up 2) He can take his usual laxative regimens Follow-up by: Iva Boop MD, Clementeen Graham,  April 13, 2010 11:39 AM  Additional Follow-up for Phone Call Additional follow up Details #1::        patient advised of Dr Marvell Fuller recommendations.   Additional Follow-up by: Darcey Nora RN, CGRN,  April 13, 2010 4:05 PM

## 2010-04-27 NOTE — Progress Notes (Signed)
Summary: Refill Trazodone & Clonazepam  Phone Note From Pharmacy Call back at [facility] Phone [phone]   Summary of Call[facility].  Last written 12/27/09--#90 x 1 [facility] Trazodone 50 mg tablet.  Last written 12/27/09--#30 x 1 Initial call taken by: Francee Piccolo CMA [facility] Dull),  March 10, 2010 9:32 AM  Follow-up for Phone Call        ok to repeat refills as last time (1 month and 1 refill) Follow-up by: Iva Boop MD, Clementeen Graham,  March 10, 2010 10:11 AM  Additional Follow-up for Phone Call Additional follow up Details #1::        Rx's printed and signed by Dr. Leone Payor.  Pt notified rx's at pharmacy.  Pt states that he is beginning to have problems with his bowel movements so he will probably call [facility] to begin PT. Additional Follow-up by: Francee Piccolo CMA Duncan Dull),  March 10, 2010 10:26 AM    Prescriptions: TRAZODONE HCL 50 MG TABS (TRAZODONE HCL) 1/2-1 tab qhs  #30 Tablet x 1   Entered by:   Francee Piccolo CMA (AAMA)   Authorized by:   Iva Boop MD, [facility]   Signed by:   Francee Piccolo CMA (AAMA) on 03/10/2010   Method used:   Printed then faxed to ...       [facility]  [address] [phone][facility] ([facility]       [address]       [place], [place]  [address]       Ph: [phone] or [phone]       Fax: [phone]   RxID:   [id number] CLONAZEPAM 2 MG TABS (CLONAZEPAM) take 1-2 tabs by mouth at bedtime as needed for sleep, take 1 tablet by mouth three times a day as needed anxiety  #90 x 1   Entered by:   Francee Piccolo CMA (AAMA)   Authorized by:   Iva Boop MD, [facility]   Signed by:   Francee Piccolo CMA (AAMA) on 03/10/2010   Method used:   Printed then faxed to ...       [facility]  [address] [phone]* (retail)       [address]       [place], [place]  [address]       Ph: [phone] or [phone]       Fax: [phone]   RxID:    [id number]

## 2010-05-09 ENCOUNTER — Encounter: Payer: Self-pay | Admitting: Internal Medicine

## 2010-05-09 ENCOUNTER — Ambulatory Visit (INDEPENDENT_AMBULATORY_CARE_PROVIDER_SITE_OTHER): Payer: 59 | Admitting: Internal Medicine

## 2010-05-09 ENCOUNTER — Telehealth: Payer: Self-pay | Admitting: Internal Medicine

## 2010-05-09 DIAGNOSIS — F329 Major depressive disorder, single episode, unspecified: Secondary | ICD-10-CM

## 2010-05-09 DIAGNOSIS — N318 Other neuromuscular dysfunction of bladder: Secondary | ICD-10-CM | POA: Insufficient documentation

## 2010-05-09 DIAGNOSIS — G4700 Insomnia, unspecified: Secondary | ICD-10-CM

## 2010-05-09 DIAGNOSIS — K5909 Other constipation: Secondary | ICD-10-CM

## 2010-05-11 ENCOUNTER — Encounter: Payer: Self-pay | Admitting: Internal Medicine

## 2010-05-11 NOTE — Discharge Summary (Signed)
Summary: Discharge Summary  NAME:  KIT, MOLLETT NO.:  [id number]   MEDICAL RECORD NO.:  [id number]          PATIENT TYPE:  AMB   LOCATION:  ENDO                         FACILITY:  [facility]   PHYSICIAN:  Iva Boop, MD,FACGDATE OF BIRTH:  November 06, 1970   DATE OF PROCEDURE:  03/04/2006  DATE OF DISCHARGE:                               OPERATIVE REPORT   INDICATIONS:  1. Diarrhea.  2. Weight loss.  3. Night sweats.  4. Abnormal CT with question partial small bowel obstruction.  He has      mild mesenteric adenopathy.  Colonoscopy and terminal ileum      investigation including random biopsies of both was unrevealing.   DATA:  Height 68 inches, weight 129 pounds, waist 29 inches, thin built.  The gastric passage time was 10 minutes.  Small bowel passage time 3  hours 1 minute.   FINDINGS:  Nonspecific changes suggestive of mild inflammation or  lymphangiectasia in the ileum.  There was some possible scalloped folds  raising a question of sprue.  There is mild duodenitis of no suspected  consequence.   SUMMARY AND RECOMMENDATIONS:  These findings are suggestive, but not  conclusive.  There is the appearance of some sort of enteritis perhaps.  They maybe an overall normal variant, however.   PLANS:  We will check tTG antibody and other labs to follow up.  EGD/enteroscopy and biopsies will be arranged most likely.  If  unrevealing may need to evaluation his adenopathy somehow.      Iva Boop, MD,FACG  Electronically Signed     CEG/MEDQ  D:  03/10/2006  T:  03/11/2006  Job:  [id number]

## 2010-05-17 NOTE — Progress Notes (Signed)
Summary: Referral to [facility] must be scheduled by [place]   Phone Note Call from Patient Call back at [phone]    Call For: Dr Leone Payor Summary of Call: Was referred to [facility] and when he called to schedule appt they adviced him that we need to call and set up for him. He cant do it himself. Initial call taken by: Leanor Kail [facility],  May 09, 2010 3:33 PM  Follow-up for Phone Call        Dr Leone Payor do I need to set him up with [facility], per your note you were going to communicate with Dr Jacinto Reap.  please advise Follow-up by: Darcey Nora RN, CGRN,  May 09, 2010 3:39 PM  Additional Follow-up for Phone Call Additional follow up Details #1::        Dr. Jacinto Reap is at [facility] and he was to go back there. ??? where [facility] came from Iva Boop MD, [facility]  May 09, 2010 4:25 PM     Additional Follow-up for Phone Call Additional follow up Details #2::    Patient was a no show at [facility] for Dr Jacinto Reap yesterday.  They tried to give him an appointment for May.  He says that if Dr Leone Payor will call personally they will move the appointment up.  Dr Leone Payor are you willing to call? Follow-up by: Darcey Nora RN, CGRN,  May 09, 2010 4:43 PM  Additional Follow-up for Phone Call Additional follow up Details #3:: Details for Additional Follow-up Action Taken: I will see what I can do with phone or letter as we discussed Iva Boop MD, [facility]  May 10, 2010 10:12 AM   Patient advised that he will try and contact Dr Alyson Ingles office but may not be able to move appointment  Additional Follow-up by: Darcey Nora RN, CGRN,  May 10, 2010 10:42 AM

## 2010-05-17 NOTE — Letter (Signed)
Summary: *Referral Letter  [facility]  [address] [place], [place] [address]   Phone: [phone]  Fax: [phone]    05/11/2010  Novella Olive. Jacinto Reap, MD [facility] of Medicine Division of Gastroenterology  Dear Dr. Jacinto Reap:  Re:   [facility]   [address]   [place], [place]  [address]  Phone: [phone] [facility]    Reason for Referral:  He has already been seen by you but recently missed an appointment and was rescheduled to May. If there is any way you could see him earlier it would be appreciated but I understand if not possible. His constipation problems are better overall off Cymbalta  but he still has severe spells. I wonder if there is an inertia problem in addition to his pelvic floor dyssynergia also ( he thinks a sits marks study was done but I am not certain he knows what that is). He does still take other medications that may contribute but has pain and psychological issues.  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 I have recommended he see a urologist (has seen briefly in past) and a psychiatrist and he is following up with his neurosurgeon though repeat surgery is not indicated.  Current Medications: 1)  HYDROCODONE-ACETAMINOPHEN 10-325 MG TABS (HYDROCODONE-ACETAMINOPHEN) take 1-2 tablets by mouth every 6 hours as needed 2)  VALIUM 10 MG TABS (DIAZEPAM) one tablet by mouth at bedtime 3)  VITAMIN D 1000 UNIT TABS (CHOLECALCIFEROL) once daily 4)  MENS MULTIVITAMIN PLUS  TABS (MULTIPLE VITAMINS-MINERALS) once daily 5)  TUMS 500 MG CHEW (CALCIUM CARBONATE ANTACID) as needed 6)  CLONAZEPAM 2 MG TABS (CLONAZEPAM) take 2 tabs by mouth at bedtime as needed for sleep 7)  TRAZODONE HCL 50 MG TABS (TRAZODONE HCL) 1/2-1 tab qhs 8)  DULCOLAX 5 MG TBEC (BISACODYL) take 2 tablets by mouth in the morning as needed 9)  MIRALAX   POWD (POLYETHYLENE GLYCOL 3350)  dissolve 17 g in 8oz liquid once every other day as needed 10)  OXYCONTIN 20 MG XR12H-TAB (OXYCODONE HCL) Take 1 tablet by mouth two times a day 11)  * OSTEOBIFLEX (UNMOWN DOSAGE) Take 2 tablets by mouth qam 12)  TRILYTE 420 GM  SOLR (PEG 3350-KCL-NA BICARB-NACL) use 1/2 to 1 container as directed for severe constipation   Past Medical History: 1)  celiac sprue 2)  allergic rhinitis 3)  vitamin D deficiency 4)  osteopenia 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  Sincerely,  Ashley Murrain MD, [facility]  Appended Document: *Referral Letter faxed to Dr Jacinto Reap [phone]

## 2010-05-17 NOTE — Assessment & Plan Note (Signed)
Summary: FOLLOW UP///SP CANCEL ADVISED//SP  [facility] PT.Louis White    History of Present Illness Visit Type: Follow-up Visit Primary GI MD: Stan Head MD [facility] Primary Provider: Anderson Malta, MD  Requesting Provider: n/a Chief Complaint: Patient here for routine f/u. He has been having some constipation intermittently. History of Present Illness:   40 yo wm with chronc constipation problems after lumbar surgery, depression, and celiac disease.  He is more depressed off Cymbalta though stopping that has improved constipation. Since last seen he has had two spells of "urnary and bowel paralysis" and he had to return to straight catheterization and aggreesive laxatives with 4 pills of dulcolax and MiraLax 2 glases a day.  He recently lost his disability payments of $1800/month and he is struggling to make ends meet.  He is going to see his neurosurgeon today to obtain an earlier appointment to see if he can change his pain management therapies.   GI Review of Systems    Reports bloating.      Denies abdominal pain, acid reflux, belching, chest pain, dysphagia with liquids, dysphagia with solids, heartburn, loss of appetite, nausea, vomiting, vomiting blood, weight loss, and  weight gain.      Reports constipation and  rectal bleeding.     Denies anal fissure, black tarry stools, change in bowel habit, diarrhea, diverticulosis, fecal incontinence, heme positive stool, hemorrhoids, irritable bowel syndrome, jaundice, light color stool, liver problems, and  rectal pain.    Current Medications (verified): 1)  Hydrocodone-Acetaminophen 10-325 Mg Tabs (Hydrocodone-Acetaminophen) .... Take 1-2 Tablets By Mouth Every 6 Hours As Needed 2)  Valium 10 Mg Tabs (Diazepam) .... One Tablet By Mouth At Bedtime 3)  Vitamin D 1000 Unit Tabs (Cholecalciferol) .... Once Daily 4)  Mens Multivitamin Plus  Tabs (Multiple Vitamins-Minerals) .... Once Daily 5)  Tums 500 Mg Chew (Calcium Carbonate Antacid) .... As  Needed 6)  Clonazepam 2 Mg Tabs (Clonazepam) .... Take 1-2 Tabs By Mouth At Bedtime As Needed For Sleep, Take 1 Tablet By Mouth Three Times A Day As Needed Anxiety 7)  Trazodone Hcl 50 Mg Tabs (Trazodone Hcl) .... 1/2-1 Tab Qhs 8)  Dulcolax 5 Mg Tbec (Bisacodyl) .... Take 2 Tablets By Mouth in The Morning As Needed 9)  Miralax   Powd (Polyethylene Glycol 3350) .... Dissovle 17 G in 8oz Liquid Once Every Other Day As Needed 10)  Oxycontin 20 Mg Xr12h-Tab (Oxycodone Hcl) .... Take 1 Tablet By Mouth Two Times A Day 11)  Osteobiflex (Unkown Dosage) .... 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 girlfriend 2 childen - prior marriage Alcohol Use - yes-rare Patient has never smoked.  Daily Caffeine Use: none Illicit Drug Use - no  Vital Signs:  Patient profile:   40 year old male Height:      68 inches Weight:      185.25 pounds BMI:     28.27 BSA:  1.98 Pulse rate:   80 / minute Pulse rhythm:   regular BP sitting:   100 / 64  (left arm)  Vitals Entered By: Lamona Curl CMA Duncan Dull) (May 09, 2010 11:17 AM)  Physical Exam  General:  Well developed, well nourished, no acute distress.   Impression & Recommendations:  Problem # 1:  CONSTIPATION, CHRONIC  (ICD-564.09) Assessment Unchanged Seems similar to overall, probably better since stopping Cymbala. He thinks there is more to his problem than pelvic floor dyssynergia. seems possible, especially with flares of severe constipation and difficulty urinating.  I have done as much as I can. Will refill Trilyte for more severe problems. Will communicate with Dr. Jacinto Reap about this also and he intends to follow-up with her. He says he had a sitzmarks study that was ok there  Problem # 2:  INSOMNIA (ICD-780.52) Assessment: Deteriorated Tazadone and clonazepam not really working and he is clearly frustrated and frazzled. I told him I can refill those for now but do not plan on further refills. He needs to see PCP and probably psychiatrist (vs. neurology) for further help with hopes of finding help and with limited anticholinergic side effects.  Problem # 3:  DEPRESSION (ICD-311) Assessment: Deteriorated The Cymbalta is probably missed. Another reason to see psychiatrist for appropriate meds and with better knowledge of side effectr profiles.  Problem # 4:  BLADDER DYSFUNCTION (ICD-596.59) Assessment: Unchanged occurred after lumbar surgery as did bowel problems I agreed with his plans for repeat urologic assessment he will discuss with PCP and pursue in [place] as he lives there now.  Patient Instructions: 1)  Prescriptions for your Clonazepam and Trazadone printed and given to you to take to your pharmacy. 2)  Louis White has been sent to your pharmacy electronically. 3)  Please schedule a follow-up appointment as needed.  4)  Copy sent to : Anderson Malta, MD  5)  The medication list was reviewed and reconciled.  All changed / newly prescribed medications were explained.  A complete medication list was provided to the patient / caregiver. Prescriptions: TRAZODONE HCL 50 MG TABS (TRAZODONE HCL) 1/2-1 tab qhs  #30 Tablet x 2   Entered by:   Milford Cage NCMA   Authorized by:   Iva Boop MD, [facility]   Signed by:   Milford Cage NCMA on 05/09/2010   Method used:   Print then Give to Patient   RxID:   [id number] CLONAZEPAM 2 MG TABS (CLONAZEPAM) take 2 tabs by mouth at bedtime as needed for sleep  #60 x 2   Entered by:   Milford Cage NCMA   Authorized by:   Iva Boop MD, [facility]   Signed by:   Milford Cage NCMA on 05/09/2010   Method used:   Print then Give to Patient   RxID:   [id number] TRILYTE 420 GM  SOLR (PEG 3350-KCL-NA BICARB-NACL) use 1/2 to 1 container as directed for severe constipation  #1 x 11   Entered and Authorized by:   Iva Boop MD, [facility]   Signed by:   Lamona Curl CMA (AAMA) on 05/09/2010   Method used:   Electronically to        [facility] (retail)       [address]       [place], [place]  [id number]       Ph: [phone]       Fax: [phone]   RxID:  [id number]  

## 2010-05-18 ENCOUNTER — Encounter: Payer: Self-pay | Admitting: Family Medicine

## 2010-06-06 NOTE — Letter (Signed)
Summary: [facility]  [facility]   Imported By: Maryln Gottron 05/24/2010 10:23:14  _____________________________________________________________________  External Attachment:    Type:   Image     Comment:   External Document

## 2010-07-04 LAB — CBC
HCT: 41 % (ref 39.0–52.0)
Hemoglobin: 14.2 g/dL (ref 13.0–17.0)
Platelets: 191 10*3/uL (ref 150–400)
Platelets: 224 10*3/uL (ref 150–400)
RDW: 12.4 % (ref 11.5–15.5)
WBC: 8.1 10*3/uL (ref 4.0–10.5)
WBC: 4.2 10*3/uL (ref 4.0–10.5)

## 2010-07-04 LAB — BASIC METABOLIC PANEL
BUN: 8 mg/dL (ref 6–23)
Creatinine, Ser: 1.07 mg/dL (ref 0.4–1.5)
GFR calc non Af Amer: >60 mL/min (ref >60)

## 2010-07-04 LAB — TYPE AND SCREEN: Antibody Screen: NEGATIVE

## 2010-07-04 LAB — ABO/RH: ABO/RH(D): A POS

## 2010-08-08 NOTE — Letter (Signed)
Aug 14, 2006    [facility]  [address]  [place], [place] [address]   RE:  [name], [name]  MRN:  [id number]  /  DOB:  Aug 09, 1970   To Whom It May Concern:   Louis White (date of birth 1970/12/04) is a patient of mine.  He  has a medical condition that requires a special diet.  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   There may be other circumstances where it would be required to provide  his appropriate diet as well.  I believe this would be covered under the  Americans with Disabilities Act.   Please do not hesitate to contact me if you have other questions.    Sincerely,      Iva Boop, MD,FACG  Electronically Signed    CEG/MedQ  DD: 08/14/2006  DT: 08/14/2006  Job #: [id number]

## 2010-08-08 NOTE — Discharge Summary (Signed)
Louis White, Louis White                ACCOUNT NO.:  [id number]   MEDICAL RECORD NO.:  [id number]          PATIENT TYPE:  INP   LOCATION:  3018                         FACILITY:  [facility]   PHYSICIAN:  Stefani Dama, M.D.  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   OPERATIONS AND PROCEDURES:  Anterior lumbar interbody fusion and  decompression L4-L5 and L5-S1.   Surgery was performed by Dr. Lovell Sheehan, co-surgeon Dr. Gretta Began.   BRIEF HISTORY AND HOSPITAL COURSE:  The patient is a 41 year old  gentleman with degenerative disk disease at two levels L4-L5 and L5-S1.  He has had long-standing low back pain and failed conservative care and  elects to proceed with surgical intervention.  It was felt that he would  be best served with an anterior lumbar interbody fusion in two levels  for his two-level degenerative disk disease at L4-L5 and L5-S1.  He  underwent surgical intervention on Aug 09, 2008, tolerated the procedure  well.  Dr. Lovell Sheehan and Dr. Tawanna Cooler Early did surgery.  First day  postoperatively, the patient was on a morphine PCA pump for pain  control.  He was making good progress.  He was able to get out of bed  and do some safe ambulation.  He was fitted with a LSO for support.  He  was started with physical therapy and occupational therapy to help with  activities of daily living and progressive ambulation.  Postoperatively,  his hemoglobin was 11.6, hematocrit 33.3, mild acute blood loss anemia  to be expected postoperatively.  The patient made good progress.  Second  day postoperatively he was weaned off the morphine PCA pump to p.o. pain  medicines.  He was comfortable taking Valium and  Percocet, continued to  make slow progress for progressive ambulation.  On Aug 12, 2008 the  patient had had a significant amount of pain flare on the day before,  and he wanted to work with physical therapy and continuing work on safe  independent ambulation during one more day of hospitalization for  continued pain control and was deemed ready for discharge home on Aug 13, 2008.  Arrangements were being made for discharge home on Friday  morning.  Dr. Danielle Dess will be there to discharge the patient.  Again he  will be taking Percocet for pain control and Valium for muscle  relaxation.  He will follow up with Dr. Lovell Sheehan in 2-3 weeks in our  office.  He is to follow back precautions that he has gone through  physical therapy here in the hospital.  All questions are encouraged,  answered, and addressed.   DISCHARGE INSTRUCTIONS:  Discharge home.   DISCHARGE MEDICATIONS:  1. Percocet 10/325 one p.o. q. 4-5 h. p.r.n. pain.  2. Valium 5 mg one p.o. q.6 h. p.r.n. muscle spasm.  3. Vitamin D.   Encouraged on mobilization for prophylaxis against DVT.  Continue with  his LSO for lumbar support.  Contact our office prior to followup if  there are any questions or concerns.   DISCHARGE CONDITION:  Stable and improved.      Aura Fey Bobbe Medico.      Stefani Dama, M.D.  Electronically Signed    SCI/MEDQ  D:  08/12/2008  T:  08/13/2008  Job:  [id number]

## 2010-08-08 NOTE — Op Note (Signed)
NAMEDAIMIEN, PATMON NO.:  [id number]   MEDICAL RECORD NO.:  [id number]          PATIENT TYPE:  INP   LOCATION:  3018                         FACILITY:  [facility]   PHYSICIAN:  Cristi Loron, M.D.DATE OF BIRTH:  May 18, 1970   DATE OF PROCEDURE:  08/09/2008  DATE OF DISCHARGE:                               OPERATIVE REPORT   BRIEF HISTORY:  The patient is a 40 year old white male who has suffered  from intractable back pain.  He failed nonsurgical management.  He was  worked up with a lumbar MRI and diskogram, which demonstrated the  patient had pain at L4-L5 with degenerative disk at L4-L5 and L5-S1  and discussed various treatment options with the patient including  surgery.  He is aware of the risks, benefits, and alternatives to  surgery and decided to proceed with a L4-L5 anterior lumbar interbody  fusion and placement of the interbody prosthesis as well as  instrumentation.   PREOPERATIVE DIAGNOSES:  L4-L5 and L5-S1 disk herniation, lumbago, and  lumbar radiculopathy.   POSTOPERATIVE DIAGNOSES:  L4-L5 and L5-S1 disk herniation, lumbago, and  lumbar radiculopathy.   PROCEDURE:  L4-L5 and L5-S1 anterior lumbar interbody fusion with local  morselized autograft bone, and Actifuse bone graft extender and bone  morphogenic protein; insertion of L4-L5 and L5-S1 interbody prosthesis  (Synthes interbody prosthesis, L4-L5 and L5-S1 anterior instrumentation  with Synthes titanium screws).   SURGEON:  Cristi Loron, MD.   Mammie LorenzoLarina Earthly, M.D.   ANESTHESIA:  General endotracheal.   ESTIMATED BLOOD LOSS:  200 mL.   SPECIMENS:  None.   DRAINS:  None.   COMPLICATIONS:  None.   DESCRIPTION OF PROCEDURE:  The patient was brought to the operative room  by the anesthesia team.  General endotracheal anesthesia was induced.  The patient remained in supine position.  His abdomen was then shaved  with clippers and prepared with Betadine scrub and  Betadine solution.  Sterile drapes were applied.  Dr. Arbie Cookey did the exposure surgeries.  For  further details of this, please refer to his operative note.   Once Dr. Arbie Cookey had provided exposure and placed the retractors exposing  L4-L5 intervertebral disk, I took over the operation.  I used #15 blade  scalpel to incise the L4-L5 intervertebral disk.  We performed a partial  intervertebral diskectomy using the pituitary forceps.  We prepared the  vertebral endplates for the fusion by clearing the soft tissue using the  curettes as well as high speed drill.  We then used the trial spacers  and determined to use a 15 mm with 8 degree lordosis interbody  prosthesis which was 30 mm depth and 30 mm width.  We prefilled this  prosthesis with a combination of morselized autograft bone.  We obtained  decompression as well as Actifuse bone graft extender and bone  morphogenic protein soaked collagen sponges.  We inserted the prosthesis  into the L4-L5 interspace under fluoroscopic guidance.  We then placed  the anterior instrumentation, ie, with placing 4 20 mm titanium screws  with prosthesis at L4-L5.  In order to repeat this procedure at L5-S1, we removed the retractors  exposing the lower disk, incised the L5-S1 intervertebral disk,  performed a partial diskectomy with pituitary forceps.  We prepared the  vertebral end plates using the curette.  We removed the soft tissue from  the vertebral end plates using the curette and high speed drill.  We  used trial spacers and determined to use a 30-mm depth, 38-mm width, 50-  mm height, and 12-degree lordotic prosthesis.  We prefilled this  prosthesis with a combination of local morselized autograft bone and  Actifuse bone graft extender and bone morphogenic protein.  We inserted  the prosthesis and distracted into the L5-S1 interspace and under  fluoroscopic guidance, we placed the four 5-mm titanium screws for  prosthesis at L5-S1 as well.  We  then confirmed the position of the  prosthesis and the anterior screws at the L4-L5 and L5-S1 with AP and  lateral fluoroscopy.  We obtained hemostasis with Bipolar  electrocautery.  We irrigated the wound with bacitracin solution.  We  then removed the retractors.  We inspected the iliac artery and veins.  There was no injury noted.  We obtained an AP x-ray, which was for  instrument check.  There was no retained instruments.  We then  reapproximated the patient's anterior sheath with a running 0 Vicryl  suture and reapproximated the patient's skin with an interrupted 2-0  Vicryl suture.  The skin was reapproximated with Dermabond.  The drapes  were removed and the patient was subsequently extubated by the  anesthesia team and transported to postanesthesia care unit in stable  condition.  All sponge, instrument, and needle counts were correct at  the end of the case.      Cristi Loron, M.D.  Electronically Signed     JDJ/MEDQ  D:  08/09/2008  T:  08/10/2008  Job:  [id number]

## 2010-08-08 NOTE — Op Note (Signed)
Louis White, Louis White                ACCOUNT NO.:  [id number]   MEDICAL RECORD NO.:  [id number]          PATIENT TYPE:  INP   LOCATION:  3018                         FACILITY:  [facility]   PHYSICIAN:  Larina Earthly, M.D.    DATE OF BIRTH:  02-Apr-1970   DATE OF PROCEDURE:  08/09/2008  DATE OF DISCHARGE:                               OPERATIVE REPORT   PREOPERATIVE DIAGNOSIS:  Degenerative disk disease L4-L5 and L5-S1.   POSTOPERATIVE DIAGNOSIS:  Degenerative disk disease L4-L5 and L5-S1.   PROCEDURE:  Anterior exposure for ALIF, which will be dictated as a note  separate by Dr. Tressie Stalker.   SURGEON:  Larina Earthly, MD   ASSISTANT:  Cristi Loron, MD.   ANESTHESIA:  General endotracheal.   COMPLICATIONS:  None.   DISPOSITION:  To recovery room.   INDICATIONS FOR THE PROCEDURE:  The patient is 40 year old gentleman  with degenerative disk disease at L4-5 and L5-S1.  He was seen in  preoperative consultation with Dr. Tressie Stalker who recommended  anterior exposure for interbody fusion.  I discussed this with [name]  in the holding area, explaining my role for exposure and also explained  the potential for injury to the iliac artery, vein and also potential  for injury to the ureter and intra-abdominal contents.  I also discussed  the possibility of sexual dysfunction to include retrograde ejaculation.  The patient understood and wished to proceed with the procedure.   PROCEDURE IN DETAIL:  The patient was taken to the operating room and  placed in supine position where the area of the abdomen prepped and  draped in usual sterile fashion.  Using fluoroscopy in lateral position,  the area of L4-5 and L5-S1 was marked on the abdomen with marking pen.  A paramedian incision was made over this area just to the left to the  midline.  This was carried down through the subcutaneous fat with  electrocautery.  The fat was mobilized off the anterior fascia.  The  anterior  fascia was opened in line with the skin incision with  electrocautery.  The rectus muscle was mobilized and was brought to the  left lateral position.  The retroperitoneal space was entered below the  centerlinear line bluntly.  Retroperitoneal contents were dissected  further to the right.  The posterior rectus fascia was opened laterally.  The peritoneum was not entered.  The peritoneal contents in ureter were  mobilized to the right.  The patient had mobilization of the iliac  vessels.  The area between the iliac vessels were first exposed to give  exposure to L5-S1.  The patient had a large middle sacral vein, and this  was ligated with 2-0 silk ties and divided.  The area over the L5-S1  disk was mobilized with a KD medial and lateral to give adequate  exposure for retraction.  Next, the left iliac artery and vein were  mobilized to the right and the iliolumbar vein was identified and was  ligated with 2-0 silk ties and divided.  The iliac vessels were  mobilized to the  right and the L4-5 disk space was  exposed.  The Brau retractor was brought on to the field and was  positioned on the bed.  The medial and lateral retractors were  positioned at the L4-5 level.  The remainder of the procedure will be  dictated by Dr. Tressie Stalker as a separate note.      Larina Earthly, M.D.  Electronically Signed     TFE/MEDQ  D:  08/09/2008  T:  08/10/2008  Job:  [id number]

## 2010-08-11 NOTE — Assessment & Plan Note (Signed)
[facility]                           GASTROENTEROLOGY OFFICE NOTE   NAME:Louis White, Louis White                         MRN:          [id number]  DATE:02/11/2006                            DOB:          Jul 13, 1970    Monday, February 11, 2006.   REFERRING PHYSICIAN:  Kerby Nora, MD.   CHIEF COMPLAINT:  Diarrhea.   ASSESSMENT:  A 40 year old white man with episodic and now persistent  diarrhea with weight loss.  Extensive lab work-up with stool studies for C.  difficile, Giardia, Cryptosporidium culture, HIV testing, CBC and CMET are  all unrevealing.  Fecal fat qualitative studies negative as well.  Possibilities include inflammatory bowel disease such as Crohn's disease or  ulcerative colitis.  He is tender in the left lower quadrant which would  potentially go along with this as well.  Colon cancer possible, seems  unlikely given his age.  Possibility of celiac disease exists as well.  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   PLAN:  1. Schedule colonoscopy.  Plan to take biopsies and intubate the terminal      ileum regardless of what is seen.  2. Pending that, a CBC with differential, repeat Giardia, Cryptosporidium      and sprue panel could be indicated.  Note that his TSH was normal as      well as his other labs.  He does have a mild neutropenia of unclear      etiology.  3. Also note, he has had night sweats, CT scanning could be needed looking      for possible lymphoma.   HISTORY:  A 40 year old white man that has had the problems as described  above.  Earlier in the year, he had a couple of episodes of diarrhea, crampy  abdominal pain that was self-limited.  Things seemed to worsen and he went  to see Dr. Ermalene Searing on December 19, 2005.  He has been under a lot of  stress.  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  He does not have a history of chronic problems throughout the  years.  This has been a problem over the past year or so.  Since that time,  he has had pretty much daily problems with either explosive gas or watery  bowel movements up to 15 times a day without bleeding, mucus or other  changes.  He has a lot of fatigue and cannot really ride his bike anymore,  at least not at any length like he use to.  He tried Imodium, Pepto Bismol  and Kaopectate that either did not work or made him feel worse.  The Imodium  seemed to gas him up and make him feel worse.  He has been prescribed some  Bentyl and that has not really helped either.  Stool studies as above as  well as labs.  The only real abnormality is a slightly low white blood cell  count of 3.3 and 4.1  on two studies.  He has not had any significant  abnormality in the differential either.   PAST MEDICAL HISTORY:  1. Allergic rhinitis.  2. Chronic sinusitis.   FAMILY HISTORY:  Father and grandfather had heart problems.  Grandmother had  breast cancer.  Father and grandmother had diabetes.   SOCIAL HISTORY:  He is living with his girlfriend.  He is divorced.  Two  daughters.  He is a paramedic.  Chews tobacco.  Uses some alcohol.  No  smoking.  He works 24-hour shifts.   REVIEW OF SYSTEMS:  Fatigue, night sweats (some of them have been  drenching), muscle cramps, insomnia problems, difficulty sleeping with night  sweats, joint pains, allergies.  All other systems negative.   PHYSICAL EXAMINATION:  GENERAL APPEARANCE:  A young white man in no acute  distress, well-developed, well-nourished.  VITAL SIGNS:  Height 5 feet 8, weight 131.8 pounds, blood pressure 80/46,  pulse 76.  HEENT:  Eyes anicteric.  ENT with normal mouth, throat and pharynx.  NECK:  Supple with no masses.  CHEST:  Clear, resonant.  CARDIOVASCULAR:  S1 and S2.  I hear no murmurs, rubs, or gallops.  ABDOMEN:  Soft, nontender except in the left lower  quadrant where he is  mildly tender.  He has well-developed abdominal musculature.  RECTAL:  Deferred.  LYMPHATIC:  There is no neck, supraclavicular, axillary or inguinal  adenopathy detected.  EXTREMITIES:  No peripheral edema.  SKIN:  No rash.  NEUROLOGIC/PSYCHIATRIC:  He is alert and oriented x3.   I have reviewed the office notes and laboratory testing sent from Dr.  Daphine Deutscher office.   I appreciate the opportunity to care for this patient.     Iva Boop, MD,FACG  Electronically Signed    CEG/MedQ  DD: 02/11/2006  DT: 02/11/2006  Job #: [id number]   cc:   Kerby Nora, MD

## 2010-08-11 NOTE — Op Note (Signed)
Louis White, GUNDERMAN                ACCOUNT NO.:  [id number]   MEDICAL RECORD NO.:  [id number]          PATIENT TYPE:  AMB   LOCATION:  ENDO                         FACILITY:  [facility]   PHYSICIAN:  Iva Boop, MD,FACGDATE OF BIRTH:  Nov 25, 1970   DATE OF PROCEDURE:  03/04/2006  DATE OF DISCHARGE:                               OPERATIVE REPORT   INDICATIONS:  1. Diarrhea.  2. Weight loss.  3. Night sweats.  4. Abnormal CT with question partial small bowel obstruction.  He has      mild mesenteric adenopathy.  Colonoscopy and terminal ileum      investigation including random biopsies of both was unrevealing.   DATA:  Height 68 inches, weight 129 pounds, waist 29 inches, thin built.  The gastric passage time was 10 minutes.  Small bowel passage time 3  hours 1 minute.   FINDINGS:  Nonspecific changes suggestive of mild inflammation or  lymphangiectasia in the ileum.  There was some possible scalloped folds  raising a question of sprue.  There is mild duodenitis of no suspected  consequence.   SUMMARY AND RECOMMENDATIONS:  These findings are suggestive, but not  conclusive.  There is the appearance of some sort of enteritis perhaps.  They maybe an overall normal variant, however.   PLANS:  We will check tTG antibody and other labs to follow up.  EGD/enteroscopy and biopsies will be arranged most likely.  If  unrevealing may need to evaluation his adenopathy somehow.      Iva Boop, MD,FACG  Electronically Signed     CEG/MEDQ  D:  03/10/2006  T:  03/11/2006  Job:  [id number]
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
## 2010-08-11 NOTE — Letter (Signed)
July 01, 2007    [facility]  [address]  [place], [place] [address]   RE:  Louis White, Louis White  MRN:  [id number]  /  DOB:  1970-06-16   To Whom it May Concern:   [name] is a patient of mine.  He has a medical condition that  requires a special diet.  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   There may be other circumstances where it will be required to provide  him appropriate diet as well.  I believe this would be covered under the  Americans With Disabilities Act.   This condition  and situation will not change, this is a lifelong  condition.   Please do not hesitate to contact me if you have other questions.    Sincerely,      Iva Boop, MD,FACG  Electronically Signed    CEG/MedQ  DD: 07/01/2007  DT: 07/01/2007  Job #: [id number]

## 2010-08-11 NOTE — Assessment & Plan Note (Signed)
[facility]                             [facility] OFFICE NOTE   NAME:Beveridge, Helder                         MRN:          [id number]  DATE:12/19/2005                            DOB:          [birth year]    CHIEF COMPLAINT:  A 40 year old male, here to establish a new doctor.   HISTORY OF PRESENT ILLNESS:  Mr. Pandya recently moved from [place] and is  trying to set up a physician closer to his house.  He would like to discuss  his chronically irregular bowel movements.  He states he has had two  significant episodes over the past year of abdominal cramping and severe  diarrhea.  The first episode occurred during a time when he was very  stressed out and lasted for about one week.  This period of time was  followed by normal bowel movements and no problems.  Then about two weeks  ago he had recurrence of diarrhea, about 10 bowel movements per day.  He  also had severe lower abdominal cramps that were relieved with defecation.  He states that eating seemed to trigger the episodes, but no foods in  particular.  He tries to avoid grease, because he hates grease in general.  He denied any blood in his stool.  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  He states that he has had some intermittent night sweats over the past few  months.  In addition, since March 2005, he has gone from 132 pounds to 126  pounds.  He states that he eats a large amount and has a high metabolism,  but this is an unusual weight loss for him, despite his exercise.  He has  noticed some fluctuation of his diarrhea occasionally with some  constipation.  He denies a family history of GI disease.   REVIEW OF SYSTEMS:  Otherwise negative except occasional palpitations that  are described more as his heart pounding very hard, as opposed to  fast.   PHYSICAL EXAMINATION:  1. Allergic rhinitis.  2. Chronic sinusitis.   HOSPITALIZATION/SURGERY/PROCEDURE:  1. In 2001, sinus surgery.  2. Multiple orthopedic surgeries for various fractures.   ALLERGIES:  No known drug allergies.   MEDICATIONS:  None.  No herbal medicines.  He does take a multivitamin  daily.   FAMILY HISTORY:  Father alive at age 35, with diabetes, multiple heart  attacks and status post stent placement.  His mother is alive at age 67,  with diabetes, hypertension and cholesterol.  He has one brother who is  healthy.  His mother did have some problems with depression, and there is a  paternal grandmother with lung cancer, but there is no other family history  of cancer.   SOCIAL HISTORY:  He does not smoke and does not use other drugs, but  occasionally has some beer, about one beer per week.  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  He has been divorced for a very short time.  He stated his divorce actually went through today.  He is currently in a  relationship where he is sexually active and does not use condoms.  He  states that the divorce occurred because his wife cheated on him with his  best friend, whom he works with.  He is a cyclist as a hobby and as far as  his exercise.  He rides his bike about 200 to 300 miles per week.  His diet  includes pasta, cereals, fruit, including bananas and apples and vegetables.  He tries to avoid fast food because he does not like grease.   PHYSICAL EXAMINATION:  VITAL SIGNS:  Height 68-1/4 inches, weight 128  pounds, making the BMI 20, blood pressure 100/60, pulse 60, temperature 97  degrees.  GENERAL:  A thin-appearing male, in no apparent distress.  HEENT:  Pupils equal, round, reactive to light and accommodation.  Extraocular muscles intact.  Oropharynx clear.  Tympanic membranes clear.  Mucous membranes moist.  NECK:  No thyromegaly.  NODES:  No lymphadenopathy cervical or supraclavicular.   CARDIOVASCULAR:  A regular rate and rhythm.  No murmurs, rubs or gallops.  PULMONARY:  Clear to auscultation bilaterally.  No wheezes, rales or  rhonchi.  EXTREMITIES:  With 2+ peripheral pulses.  No peripheral edema.  ABDOMEN:  Soft, nontender.  Normoactive bowel sounds.  No  hepatosplenomegaly.  No rebound, no guarding.  RECTAL:  Hemoccult-negative.  Good rectal tone.  Exam nontender per the  patient.  No hemorrhoids seen.  MUSCULOSKELETAL:  Strength 5/5 in the upper and lower extremities.  NEUROLOGIC:  Cranial nerves II-XII grossly intact.  Reflexes 2+ patella and  bicipital.   ASSESSMENT/PLAN:  1. Chronic diarrhea with abdominal cramping:  His symptoms do sound a lot      like irritable bowel syndrome, but given his recent weight loss and      decreased energy, I feel that a further in depth workup is necessary.      We will begin by evaluating his stool for leukocytes and fat.  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  In addition, I will check a Giardia and      Cryptosporidium, because he does state he has two coworkers who have      Cryptosporidium.  He did begin having his symptoms before they were      diagnosed with Cryptosporidiosis.  In addition, I will check a complete      metabolic panel, CBC and thyroid stimulating hormone.  He will return      to see me as directed, after all of these labs are evaluated.  In the      meantime, I encouraged him to increase fiber in his diet and consider      adding something like Metamucil.  He may use B12 to increase his energy      level for his upcoming race, although I did instruct him to maybe not      work out as hard while he is still feeling poorly.  He was also given a      prescription for Bentyl 20 mg q.i.d. p.r.n. as an anti-spasmodic, to     decrease his abdominal cramping.  He will let me know how this works.  2. Prevention:  I  will obtain records from  his previous doctor.  At this      point in time, it appears      he is up-to-date with prevention, except for possibly a cholesterol      screen, if this has not been done in the past.       Kerby Nora, MD      AB/MedQ  DD:  12/19/2005  DT:  12/21/2005  Job #:  [id number]

## 2010-12-26 ENCOUNTER — Telehealth: Payer: Self-pay | Admitting: Internal Medicine

## 2010-12-26 NOTE — Telephone Encounter (Signed)
Left message for patient to call back  

## 2010-12-26 NOTE — Telephone Encounter (Signed)
Agree with advice

## 2010-12-26 NOTE — Telephone Encounter (Signed)
Patient returned call.  He reports had prostate infection last week and was started on antibiotics.  He had vomiting last week and was placed on a clear liquid diet, he was told vomiting was from prostate infection.  He states prostate trouble is improved, but can't stop vomiting.  He reports he had an episode today of vomiting old blood with clots and then more vomiting of bright red blood.  He also reports he just had BM that was black and tarry.  He denies any abdominal pain.  Only other symptom is nausea.  He wants to be seen in the office this afternoon.  Patient is advised that he will need labs and x-rays and appropriate place to be seen is the ER.  I have advised Amy Esterwood PA of the above and that patient indicated he will go to [facility] "most likely".

## 2010-12-27 ENCOUNTER — Telehealth: Payer: Self-pay | Admitting: Internal Medicine

## 2010-12-27 NOTE — Telephone Encounter (Signed)
Ok Please obtain records

## 2010-12-27 NOTE — Telephone Encounter (Signed)
Patient will go to [facility] and sign a release to have records faxed

## 2010-12-27 NOTE — Telephone Encounter (Signed)
Patient went to [facility] yesterday.  He says he was diagnosed with duodenitis, heme positive stools.  He had a CT scan abdomen.  He is still on levaquin for his prostatitis and they started him on a Prevpak, phenergan, and zofran.  He is scheduled for a follow up appt for 01/04/11

## 2011-01-04 ENCOUNTER — Ambulatory Visit: Payer: 59 | Admitting: Internal Medicine

## 2011-01-12 ENCOUNTER — Telehealth: Payer: Self-pay | Admitting: Internal Medicine

## 2011-01-12 ENCOUNTER — Ambulatory Visit: Payer: 59 | Admitting: Internal Medicine

## 2011-01-16 NOTE — Telephone Encounter (Signed)
Will not charge but before he is seen again need to be sure he can meet requirements  We can request the records for review if he will sign for them

## 2011-02-01 ENCOUNTER — Ambulatory Visit: Payer: 59 | Admitting: Internal Medicine

## 2011-03-12 ENCOUNTER — Ambulatory Visit: Payer: 59 | Admitting: Internal Medicine

## 2011-03-13 ENCOUNTER — Telehealth: Payer: Self-pay | Admitting: Internal Medicine

## 2011-03-13 NOTE — Telephone Encounter (Signed)
Dismissal Letter sent by Certified Mail to address [address] [facility] 03/13/2011  Dismissal letter returned 03/23/2011 with new address.  Dismissal Letter sent by Certified Mail to 2nd address [address], [place], [place] 03/26/2011  Dismissal Letter returned 04/25/2011  Dismissal Letter sent by 1st Class Mail to address [address], [place], [place] 04/26/2011

## 2011-10-17 ENCOUNTER — Other Ambulatory Visit: Payer: Self-pay | Admitting: Neurosurgery

## 2011-10-17 DIAGNOSIS — M549 Dorsalgia, unspecified: Secondary | ICD-10-CM

## 2011-10-18 ENCOUNTER — Inpatient Hospital Stay: Admission: RE | Admit: 2011-10-18 | Payer: 59 | Source: Ambulatory Visit

## 2011-10-24 ENCOUNTER — Inpatient Hospital Stay: Admission: RE | Admit: 2011-10-24 | Payer: 59 | Source: Ambulatory Visit

## 2011-11-08 ENCOUNTER — Ambulatory Visit
Admission: RE | Admit: 2011-11-08 | Discharge: 2011-11-08 | Disposition: A | Discharge status: Home / Self Care | Payer: Medicare Other | Source: Ambulatory Visit | Attending: Neurosurgery | Admitting: Neurosurgery

## 2011-11-08 DIAGNOSIS — M549 Dorsalgia, unspecified: Secondary | ICD-10-CM

## 2011-11-16 ENCOUNTER — Other Ambulatory Visit: Payer: Self-pay | Admitting: Neurosurgery

## 2011-11-16 DIAGNOSIS — M549 Dorsalgia, unspecified: Secondary | ICD-10-CM

## 2011-11-21 ENCOUNTER — Other Ambulatory Visit: Payer: Medicare Other

## 2011-11-27 ENCOUNTER — Ambulatory Visit
Admission: RE | Admit: 2011-11-27 | Discharge: 2011-11-27 | Disposition: A | Discharge status: Home / Self Care | Payer: Medicare Other | Source: Ambulatory Visit | Attending: Neurosurgery | Admitting: Neurosurgery

## 2011-11-27 DIAGNOSIS — M549 Dorsalgia, unspecified: Secondary | ICD-10-CM

## 2014-10-26 ENCOUNTER — Encounter (HOSPITAL_COMMUNITY): Payer: Self-pay | Admitting: Emergency Medicine

## 2014-10-26 ENCOUNTER — Emergency Department (HOSPITAL_COMMUNITY)
Admission: EM | Admit: 2014-10-26 | Discharge: 2014-10-26 | Disposition: A | Discharge status: Home / Self Care | Payer: Medicare Other | Attending: Emergency Medicine | Admitting: Emergency Medicine

## 2014-10-26 DIAGNOSIS — Z79899 Other long term (current) drug therapy: Secondary | ICD-10-CM | POA: Insufficient documentation

## 2014-10-26 DIAGNOSIS — L03115 Cellulitis of right lower limb: Secondary | ICD-10-CM | POA: Diagnosis present

## 2014-10-26 DIAGNOSIS — Z8719 Personal history of other diseases of the digestive system: Secondary | ICD-10-CM | POA: Insufficient documentation

## 2014-10-26 DIAGNOSIS — Z87448 Personal history of other diseases of urinary system: Secondary | ICD-10-CM | POA: Insufficient documentation

## 2014-10-26 DIAGNOSIS — M791 Myalgia: Secondary | ICD-10-CM | POA: Insufficient documentation

## 2014-10-26 MED ORDER — SULFAMETHOXAZOLE-TRIMETHOPRIM 800-160 MG PO TABS
1.0000 | ORAL_TABLET | Freq: Once | ORAL | Status: AC
  Administered 2014-10-26: 1 via ORAL
  Filled 2014-10-26: qty 1

## 2014-10-26 MED ORDER — CEPHALEXIN 250 MG PO CAPS
500.0000 mg | ORAL_CAPSULE | Freq: Once | ORAL | Status: AC
  Administered 2014-10-26: 500 mg via ORAL
  Filled 2014-10-26: qty 2

## 2014-10-26 MED ORDER — CEPHALEXIN 500 MG PO CAPS: 500.0000 mg | ORAL_CAPSULE | Freq: Four times a day (QID) | ORAL | Status: AC

## 2014-10-26 MED ORDER — SULFAMETHOXAZOLE-TRIMETHOPRIM 800-160 MG PO TABS: 1.0000 | ORAL_TABLET | Freq: Two times a day (BID) | ORAL | Status: AC

## 2014-10-26 NOTE — Discharge Instructions (Signed)
Cellulitis Return for fever or increased redness. Take anabiotic says prescribed. Apply warm compresses several times a day to bring the infection to the surface. Elevate. Cellulitis is an infection of the skin and the tissue under the skin. The infected area is usually red and tender. This happens most often in the arms and lower legs. HOME CARE   Take your antibiotic medicine as told. Finish the medicine even if you start to feel better.  Keep the infected arm or leg raised (elevated).  Put a warm cloth on the area up to 4 times per day.  Only take medicines as told by your doctor.  Keep all doctor visits as told. GET HELP IF:  You see red streaks on the skin coming from the infected area.  Your red area gets bigger or turns a dark color.  Your bone or joint under the infected area is painful after the skin heals.  Your infection comes back in the same area or different area.  You have a puffy (swollen) bump in the infected area.  You have new symptoms.  You have a fever. GET HELP RIGHT AWAY IF:   You feel very sleepy.  You throw up (vomit) or have watery poop (diarrhea).  You feel sick and have muscle aches and pains. MAKE SURE YOU:   Understand these instructions.  Will watch your condition.  Will get help right away if you are not doing well or get worse. Document Released: 08/29/2007 Document Revised: 07/27/2013 Document Reviewed: 05/28/2011 [facility] Patient Information 2015 [place], [place]. This information is not intended to replace advice given to you by your health care provider. Make sure you discuss any questions you have with your health care provider.

## 2014-10-26 NOTE — ED Provider Notes (Signed)
CSN: [id number]     Arrival date & time 10/26/14  1604 History  This chart was scribed for non-physician practitioner, Catha Gosselin, PA-C working with Raeford Razor, MD by Placido Sou, ED scribe. This patient was seen in room TR02C/TR02C and the patient's care was started at 4:32 PM.   Chief Complaint  Patient presents with  . Insect Bite   The history is provided by the patient. No language interpreter was used.   HPI Comments: Louis White is a 44 y.o. male who presents to the Emergency Department complaining of an insect bite to the medial aspect of his right ankle with onset 1 day ago. Pt notes that he believes he was bitten on the right ankle with resulting, worsening, redness, irritation, swelling and moderate pain that radiates up to his right calf. Pt describes his pain as a tightness and notes a worsening of pain when palpating the affected region. He notes applying ice and elevating the affected leg with little relief of the pain and swelling. Pt denies any hx of cardiac issues or DM. He denies taking any medications for pain management or any known drug allergies. Pt denies fever or chills.   Past Medical History  Diagnosis Date  . Celiac disease   . Allergic rhinitis   . Vitamin D deficiency   . Osteopenia   . Duodenitis   . Prostatitis   . Pelvic floor dysfunction     dyssynergia  . Bladder dysfunction   . Priapism    Past Surgical History  Procedure Laterality Date  . Nasal sinus surgery      multiple  . Orthopedic surgery      multiple fractures  . Lumbar fusion      L4-5, L5-S1  . Colonoscopy w/ biopsies  02/12/2006    internal hemorrhoids  . Upper gastrointestinal endoscopy  03/14/2006    sprue, gastritis  . Anorectal manometry  02/07/2010   Family History  Problem Relation Age of Onset  . Crohn's disease      neice  . Diabetes Father   . Diabetes Mother   . Diabetes      uncle   . Diabetes      grandmother  . Heart disease Father   . Heart  disease      uncle   . Heart disease      grandfather  . Kidney disease Father   . Breast cancer      grandmother   History  Substance Use Topics  . Smoking status: Never Smoker   . Smokeless tobacco: Not on file  . Alcohol Use: Yes     Comment: rare    Review of Systems  Constitutional: Negative for fever and chills.  Musculoskeletal: Positive for myalgias and joint swelling.  Skin: Positive for color change and wound.    Allergies  Review of patient's allergies indicates no known allergies.  Home Medications   Prior to Admission medications   Medication Sig Start Date End Date Taking? Authorizing Provider  bisacodyl (BISACODYL) 5 MG EC tablet Take 10 mg by mouth daily as needed for mild constipation.    Yes Historical Provider, MD  calcium carbonate (TUMS - DOSED IN MG ELEMENTAL CALCIUM) 500 MG chewable tablet Chew 1 tablet by mouth daily as needed for indigestion or heartburn.    Yes Historical Provider, MD  gabapentin (NEURONTIN) 600 MG tablet Take 600 mg by mouth 3 (three) times daily.   Yes Historical Provider, MD  methadone (DOLOPHINE) 10 MG  tablet Take 10 mg by mouth 3 (three) times daily.   Yes Historical Provider, MD  Multiple Vitamins-Minerals (MENS MULTIVITAMIN PLUS) TABS Take 1 tablet by mouth daily.     Yes Historical Provider, MD  polyethylene glycol powder (GLYCOLAX/MIRALAX) powder Take 17 g by mouth every other day as needed.     Yes Historical Provider, MD  promethazine (PHENERGAN) 25 MG tablet Take 25 mg by mouth every 6 (six) hours as needed for nausea or vomiting.   Yes Historical Provider, MD  tiZANidine (ZANAFLEX) 2 MG tablet Take 2 mg by mouth at bedtime.   Yes Historical Provider, MD  cephALEXin (KEFLEX) 500 MG capsule Take 1 capsule (500 mg total) by mouth 4 (four) times daily. 10/26/14   Sherena Machorro Patel-Mills, PA-C  sulfamethoxazole-trimethoprim (BACTRIM DS,SEPTRA DS) 800-160 MG per tablet Take 1 tablet by mouth 2 (two) times daily. 10/26/14 11/02/14  Eldora Napp  Patel-Mills, PA-C   BP 143/80 mmHg  Pulse 74  Temp(Src) 97.9 F (36.6 C) (Oral)  Resp 20  Wt 179 lb 9.6 oz (81.466 kg)  SpO2 99% Physical Exam  Constitutional: He is oriented to person, place, and time. He appears well-developed and well-nourished. No distress.  HENT:  Head: Normocephalic and atraumatic.  Eyes: Conjunctivae and EOM are normal.  Neck: Normal range of motion. Neck supple. No tracheal deviation present.  Cardiovascular: Normal rate.   2+ DP Pulse Right Foot  Abdominal: Soft.  Musculoskeletal: Normal range of motion.  Neurological: He is alert and oriented to person, place, and time.  Skin: Skin is warm and dry.  Single puncture wound small 1/2 cm bullous filled with fluid. There is a 6 cm in diameter erythematous, edematous and tender area over the right medial malleolus; no streaking or fluctuance. Able to flex and extend toes without difficulty. Able to dorsi and plantar flex.   Psychiatric: He has a normal mood and affect. His behavior is normal.  Nursing note and vitals reviewed.   ED Course  Procedures  DIAGNOSTIC STUDIES: Oxygen Saturation is 99% on RA, normal by my interpretation.    COORDINATION OF CARE: 4:35 PM Discussed treatment plan with pt at bedside and pt agreed to plan.  Labs Review Labs Reviewed - No data to display  Imaging Review No results found.   EKG Interpretation None      MDM   Final diagnoses:  Cellulitis of right lower leg   Patient is not immunocompromised and presents with left lateral malleolus cellulitis. He is afebrile. I put the patient on Bactrim and Keflex. I discussed strict return precautions such as fever, increased swelling, and no improvement within 48 hours. Patient can apply warm compresses and elevate the foot. He verbally agrees with the plan.  I personally performed the services described in this documentation, which was scribed in my presence. The recorded information has been reviewed and is  accurate.    Catha Gosselin, PA-C 10/26/14 2108  Raeford Razor, MD 10/27/14 [phone]

## 2014-10-26 NOTE — ED Notes (Signed)
Pt sts insect bite to right ankle area with redness swelling and pain that happened yesterday

## 2014-10-27 ENCOUNTER — Telehealth: Payer: Self-pay | Admitting: *Deleted

## 2014-10-27 NOTE — Telephone Encounter (Signed)
Dr. Milinda Antis sent me a message saying "Please schedule f/u with me this week for re check of cellulitis-thanks"  Called pt and pt declined to schedule an appt he said he "doesn't see Dr. Milinda Antis anymore"

## 2014-10-27 NOTE — Telephone Encounter (Signed)
See Dr. Royden Purl note, please remove her as PCP on chart

## 2014-10-27 NOTE — Telephone Encounter (Signed)
Then please have someone take me off his chart as PCP -thanks
